# Patient Record
Sex: Male | Born: 1935 | Race: White | Hispanic: No | Marital: Married | Smoking: Never smoker
Health system: Southern US, Community
[De-identification: ages and names within clinical notes are randomized; demographics above are authoritative.]

## PROBLEM LIST (undated history)

## (undated) DIAGNOSIS — K439 Ventral hernia without obstruction or gangrene: Secondary | ICD-10-CM

## (undated) DIAGNOSIS — Z8709 Personal history of other diseases of the respiratory system: Secondary | ICD-10-CM

## (undated) DIAGNOSIS — I209 Angina pectoris, unspecified: Secondary | ICD-10-CM

## (undated) DIAGNOSIS — I2510 Atherosclerotic heart disease of native coronary artery without angina pectoris: Secondary | ICD-10-CM

## (undated) DIAGNOSIS — E785 Hyperlipidemia, unspecified: Secondary | ICD-10-CM

## (undated) DIAGNOSIS — Z860100 Personal history of colon polyps, unspecified: Secondary | ICD-10-CM

## (undated) DIAGNOSIS — R3915 Urgency of urination: Secondary | ICD-10-CM

## (undated) DIAGNOSIS — N400 Enlarged prostate without lower urinary tract symptoms: Secondary | ICD-10-CM

## (undated) DIAGNOSIS — M152 Bouchard's nodes (with arthropathy): Secondary | ICD-10-CM

## (undated) DIAGNOSIS — R001 Bradycardia, unspecified: Secondary | ICD-10-CM

## (undated) DIAGNOSIS — R3129 Other microscopic hematuria: Secondary | ICD-10-CM

## (undated) DIAGNOSIS — Z950 Presence of cardiac pacemaker: Secondary | ICD-10-CM

## (undated) DIAGNOSIS — I714 Abdominal aortic aneurysm, without rupture: Secondary | ICD-10-CM

## (undated) DIAGNOSIS — R269 Unspecified abnormalities of gait and mobility: Secondary | ICD-10-CM

## (undated) DIAGNOSIS — Z8619 Personal history of other infectious and parasitic diseases: Secondary | ICD-10-CM

## (undated) DIAGNOSIS — R55 Syncope and collapse: Secondary | ICD-10-CM

## (undated) DIAGNOSIS — I4891 Unspecified atrial fibrillation: Secondary | ICD-10-CM

## (undated) DIAGNOSIS — R29898 Other symptoms and signs involving the musculoskeletal system: Secondary | ICD-10-CM

## (undated) DIAGNOSIS — I10 Essential (primary) hypertension: Secondary | ICD-10-CM

## (undated) DIAGNOSIS — K5790 Diverticulosis of intestine, part unspecified, without perforation or abscess without bleeding: Secondary | ICD-10-CM

## (undated) DIAGNOSIS — G729 Myopathy, unspecified: Secondary | ICD-10-CM

## (undated) HISTORY — PX: LOW ANTERIOR BOWEL RESECTION: SUR1240

## (undated) HISTORY — DX: Unspecified atrial fibrillation: I48.91

## (undated) HISTORY — DX: Atherosclerotic heart disease of native coronary artery without angina pectoris: I25.10

## (undated) HISTORY — DX: Essential (primary) hypertension: I10

## (undated) HISTORY — PX: CORONARY ARTERY BYPASS GRAFT: SHX141

## (undated) HISTORY — DX: Syncope and collapse: R55

## (undated) HISTORY — DX: Abdominal aortic aneurysm, without rupture: I71.4

## (undated) HISTORY — DX: Personal history of colonic polyps: Z86.010

## (undated) HISTORY — DX: Bouchard's nodes (with arthropathy): M15.2

## (undated) HISTORY — PX: CATARACT EXTRACTION: SUR2

## (undated) HISTORY — DX: Unspecified abnormalities of gait and mobility: R26.9

## (undated) HISTORY — PX: COLON RESECTION: SHX5231

## (undated) HISTORY — DX: Personal history of colon polyps, unspecified: Z86.0100

## (undated) HISTORY — PX: OTHER SURGICAL HISTORY: SHX169

## (undated) HISTORY — PX: CARDIAC CATHETERIZATION: SHX172

## (undated) HISTORY — DX: Myopathy, unspecified: G72.9

## (undated) HISTORY — DX: Bradycardia, unspecified: R00.1

## (undated) HISTORY — DX: Other microscopic hematuria: R31.29

## (undated) HISTORY — DX: Hyperlipidemia, unspecified: E78.5

## (undated) HISTORY — DX: Benign prostatic hyperplasia without lower urinary tract symptoms: N40.0

## (undated) HISTORY — PX: COLON SURGERY: SHX602

## (undated) HISTORY — DX: Urgency of urination: R39.15

## (undated) HISTORY — DX: Other symptoms and signs involving the musculoskeletal system: R29.898

## (undated) HISTORY — DX: Ventral hernia without obstruction or gangrene: K43.9

## (undated) HISTORY — DX: Diverticulosis of intestine, part unspecified, without perforation or abscess without bleeding: K57.90

---
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
## 2000-02-26 ENCOUNTER — Ambulatory Visit (HOSPITAL_COMMUNITY): Admission: RE | Admit: 2000-02-26 | Discharge: 2000-02-26 | Payer: Self-pay | Admitting: Gastroenterology

## 2003-06-17 DIAGNOSIS — I209 Angina pectoris, unspecified: Secondary | ICD-10-CM

## 2003-06-17 HISTORY — PX: OTHER SURGICAL HISTORY: SHX169

## 2003-06-17 HISTORY — DX: Angina pectoris, unspecified: I20.9

## 2003-09-22 ENCOUNTER — Encounter: Admission: RE | Admit: 2003-09-22 | Discharge: 2003-09-22 | Payer: Self-pay | Admitting: Internal Medicine

## 2003-11-08 ENCOUNTER — Encounter: Admission: RE | Admit: 2003-11-08 | Discharge: 2003-11-08 | Payer: Self-pay | Admitting: Interventional Cardiology

## 2003-11-09 ENCOUNTER — Inpatient Hospital Stay (HOSPITAL_BASED_OUTPATIENT_CLINIC_OR_DEPARTMENT_OTHER): Admission: RE | Admit: 2003-11-09 | Discharge: 2003-11-09 | Payer: Self-pay | Admitting: Interventional Cardiology

## 2003-11-16 ENCOUNTER — Inpatient Hospital Stay (HOSPITAL_COMMUNITY): Admission: RE | Admit: 2003-11-16 | Discharge: 2003-11-25 | Payer: Self-pay | Admitting: Surgery

## 2006-08-13 ENCOUNTER — Ambulatory Visit (HOSPITAL_BASED_OUTPATIENT_CLINIC_OR_DEPARTMENT_OTHER): Admission: RE | Admit: 2006-08-13 | Discharge: 2006-08-13 | Payer: Self-pay | Admitting: Urology

## 2010-06-16 HISTORY — PX: EYE SURGERY: SHX253

## 2010-06-19 ENCOUNTER — Ambulatory Visit: Payer: Self-pay | Admitting: Ophthalmology

## 2010-07-02 ENCOUNTER — Ambulatory Visit: Payer: Self-pay | Admitting: Ophthalmology

## 2010-10-01 ENCOUNTER — Ambulatory Visit: Payer: Self-pay | Admitting: Ophthalmology

## 2011-06-25 DIAGNOSIS — J209 Acute bronchitis, unspecified: Secondary | ICD-10-CM | POA: Diagnosis not present

## 2011-07-09 DIAGNOSIS — R5381 Other malaise: Secondary | ICD-10-CM | POA: Diagnosis not present

## 2011-07-09 DIAGNOSIS — R55 Syncope and collapse: Secondary | ICD-10-CM | POA: Diagnosis not present

## 2011-07-09 DIAGNOSIS — E782 Mixed hyperlipidemia: Secondary | ICD-10-CM | POA: Diagnosis not present

## 2011-07-09 DIAGNOSIS — I10 Essential (primary) hypertension: Secondary | ICD-10-CM | POA: Diagnosis not present

## 2011-07-09 DIAGNOSIS — I2510 Atherosclerotic heart disease of native coronary artery without angina pectoris: Secondary | ICD-10-CM | POA: Diagnosis not present

## 2011-07-09 DIAGNOSIS — I4891 Unspecified atrial fibrillation: Secondary | ICD-10-CM | POA: Diagnosis not present

## 2011-08-21 ENCOUNTER — Other Ambulatory Visit: Payer: Self-pay | Admitting: Internal Medicine

## 2011-08-21 DIAGNOSIS — IMO0002 Reserved for concepts with insufficient information to code with codable children: Secondary | ICD-10-CM | POA: Diagnosis not present

## 2011-08-21 DIAGNOSIS — M5416 Radiculopathy, lumbar region: Secondary | ICD-10-CM

## 2011-08-21 DIAGNOSIS — R3915 Urgency of urination: Secondary | ICD-10-CM | POA: Diagnosis not present

## 2011-08-21 DIAGNOSIS — R5383 Other fatigue: Secondary | ICD-10-CM | POA: Diagnosis not present

## 2011-08-21 DIAGNOSIS — R5381 Other malaise: Secondary | ICD-10-CM | POA: Diagnosis not present

## 2011-08-21 DIAGNOSIS — I10 Essential (primary) hypertension: Secondary | ICD-10-CM | POA: Diagnosis not present

## 2011-08-21 DIAGNOSIS — I2510 Atherosclerotic heart disease of native coronary artery without angina pectoris: Secondary | ICD-10-CM | POA: Diagnosis not present

## 2011-08-21 DIAGNOSIS — Z1331 Encounter for screening for depression: Secondary | ICD-10-CM | POA: Diagnosis not present

## 2011-08-21 DIAGNOSIS — E782 Mixed hyperlipidemia: Secondary | ICD-10-CM | POA: Diagnosis not present

## 2011-08-21 DIAGNOSIS — Z0000 Encounter for general adult medical examination without abnormal findings: Secondary | ICD-10-CM | POA: Diagnosis not present

## 2011-08-21 DIAGNOSIS — Z79899 Other long term (current) drug therapy: Secondary | ICD-10-CM | POA: Diagnosis not present

## 2011-08-21 DIAGNOSIS — H6120 Impacted cerumen, unspecified ear: Secondary | ICD-10-CM | POA: Diagnosis not present

## 2011-08-22 ENCOUNTER — Ambulatory Visit
Admission: RE | Admit: 2011-08-22 | Discharge: 2011-08-22 | Disposition: A | Discharge status: Home / Self Care | Payer: Medicare Other | Source: Ambulatory Visit | Attending: Internal Medicine | Admitting: Internal Medicine

## 2011-08-22 DIAGNOSIS — M47817 Spondylosis without myelopathy or radiculopathy, lumbosacral region: Secondary | ICD-10-CM | POA: Diagnosis not present

## 2011-08-22 DIAGNOSIS — M5416 Radiculopathy, lumbar region: Secondary | ICD-10-CM

## 2011-08-22 DIAGNOSIS — M5126 Other intervertebral disc displacement, lumbar region: Secondary | ICD-10-CM | POA: Diagnosis not present

## 2011-08-22 DIAGNOSIS — M8448XA Pathological fracture, other site, initial encounter for fracture: Secondary | ICD-10-CM | POA: Diagnosis not present

## 2011-09-08 DIAGNOSIS — R3915 Urgency of urination: Secondary | ICD-10-CM | POA: Diagnosis not present

## 2011-09-08 DIAGNOSIS — R351 Nocturia: Secondary | ICD-10-CM | POA: Diagnosis not present

## 2011-09-08 DIAGNOSIS — N401 Enlarged prostate with lower urinary tract symptoms: Secondary | ICD-10-CM | POA: Diagnosis not present

## 2011-10-10 DIAGNOSIS — R351 Nocturia: Secondary | ICD-10-CM | POA: Diagnosis not present

## 2011-10-10 DIAGNOSIS — R3915 Urgency of urination: Secondary | ICD-10-CM | POA: Diagnosis not present

## 2011-10-21 DIAGNOSIS — R351 Nocturia: Secondary | ICD-10-CM | POA: Diagnosis not present

## 2011-10-21 DIAGNOSIS — N401 Enlarged prostate with lower urinary tract symptoms: Secondary | ICD-10-CM | POA: Diagnosis not present

## 2011-10-21 DIAGNOSIS — N281 Cyst of kidney, acquired: Secondary | ICD-10-CM | POA: Diagnosis not present

## 2011-10-21 DIAGNOSIS — R3915 Urgency of urination: Secondary | ICD-10-CM | POA: Diagnosis not present

## 2011-11-04 DIAGNOSIS — R55 Syncope and collapse: Secondary | ICD-10-CM | POA: Diagnosis not present

## 2011-11-04 DIAGNOSIS — Z951 Presence of aortocoronary bypass graft: Secondary | ICD-10-CM | POA: Diagnosis not present

## 2011-11-04 DIAGNOSIS — I2510 Atherosclerotic heart disease of native coronary artery without angina pectoris: Secondary | ICD-10-CM | POA: Diagnosis not present

## 2011-11-04 DIAGNOSIS — I10 Essential (primary) hypertension: Secondary | ICD-10-CM | POA: Diagnosis not present

## 2011-11-04 DIAGNOSIS — E785 Hyperlipidemia, unspecified: Secondary | ICD-10-CM | POA: Diagnosis not present

## 2011-11-04 DIAGNOSIS — R6889 Other general symptoms and signs: Secondary | ICD-10-CM | POA: Diagnosis not present

## 2011-11-14 DIAGNOSIS — I2510 Atherosclerotic heart disease of native coronary artery without angina pectoris: Secondary | ICD-10-CM | POA: Diagnosis not present

## 2011-11-14 DIAGNOSIS — I10 Essential (primary) hypertension: Secondary | ICD-10-CM | POA: Diagnosis not present

## 2011-11-14 DIAGNOSIS — IMO0002 Reserved for concepts with insufficient information to code with codable children: Secondary | ICD-10-CM | POA: Diagnosis not present

## 2011-11-14 DIAGNOSIS — R5381 Other malaise: Secondary | ICD-10-CM | POA: Diagnosis not present

## 2011-11-14 DIAGNOSIS — R5383 Other fatigue: Secondary | ICD-10-CM | POA: Diagnosis not present

## 2011-11-14 DIAGNOSIS — E782 Mixed hyperlipidemia: Secondary | ICD-10-CM | POA: Diagnosis not present

## 2011-11-14 DIAGNOSIS — R55 Syncope and collapse: Secondary | ICD-10-CM | POA: Diagnosis not present

## 2011-11-17 DIAGNOSIS — I10 Essential (primary) hypertension: Secondary | ICD-10-CM | POA: Diagnosis not present

## 2011-11-17 DIAGNOSIS — E782 Mixed hyperlipidemia: Secondary | ICD-10-CM | POA: Diagnosis not present

## 2011-11-17 DIAGNOSIS — I2510 Atherosclerotic heart disease of native coronary artery without angina pectoris: Secondary | ICD-10-CM | POA: Diagnosis not present

## 2011-11-17 DIAGNOSIS — R55 Syncope and collapse: Secondary | ICD-10-CM | POA: Diagnosis not present

## 2011-11-24 ENCOUNTER — Encounter: Payer: Self-pay | Admitting: Interventional Cardiology

## 2011-11-24 DIAGNOSIS — R55 Syncope and collapse: Secondary | ICD-10-CM | POA: Diagnosis not present

## 2011-11-24 DIAGNOSIS — I2510 Atherosclerotic heart disease of native coronary artery without angina pectoris: Secondary | ICD-10-CM | POA: Diagnosis not present

## 2011-12-08 DIAGNOSIS — I2510 Atherosclerotic heart disease of native coronary artery without angina pectoris: Secondary | ICD-10-CM | POA: Diagnosis not present

## 2011-12-08 DIAGNOSIS — I4510 Unspecified right bundle-branch block: Secondary | ICD-10-CM | POA: Diagnosis not present

## 2011-12-08 DIAGNOSIS — R55 Syncope and collapse: Secondary | ICD-10-CM | POA: Diagnosis not present

## 2011-12-08 DIAGNOSIS — I4891 Unspecified atrial fibrillation: Secondary | ICD-10-CM | POA: Diagnosis not present

## 2011-12-08 DIAGNOSIS — E782 Mixed hyperlipidemia: Secondary | ICD-10-CM | POA: Diagnosis not present

## 2011-12-11 ENCOUNTER — Encounter: Payer: Self-pay | Admitting: *Deleted

## 2011-12-12 ENCOUNTER — Encounter: Payer: Self-pay | Admitting: Internal Medicine

## 2011-12-12 ENCOUNTER — Ambulatory Visit (INDEPENDENT_AMBULATORY_CARE_PROVIDER_SITE_OTHER): Payer: Medicare Other | Admitting: Internal Medicine

## 2011-12-12 ENCOUNTER — Encounter: Payer: Self-pay | Admitting: *Deleted

## 2011-12-12 VITALS — BP 128/72 | HR 60 | Ht 66.0 in | Wt 162.0 lb

## 2011-12-12 DIAGNOSIS — I498 Other specified cardiac arrhythmias: Secondary | ICD-10-CM

## 2011-12-12 DIAGNOSIS — I4891 Unspecified atrial fibrillation: Secondary | ICD-10-CM | POA: Diagnosis not present

## 2011-12-12 DIAGNOSIS — I2510 Atherosclerotic heart disease of native coronary artery without angina pectoris: Secondary | ICD-10-CM | POA: Diagnosis not present

## 2011-12-12 DIAGNOSIS — R55 Syncope and collapse: Secondary | ICD-10-CM | POA: Diagnosis not present

## 2011-12-12 DIAGNOSIS — R001 Bradycardia, unspecified: Secondary | ICD-10-CM

## 2011-12-12 DIAGNOSIS — I495 Sick sinus syndrome: Secondary | ICD-10-CM

## 2011-12-12 LAB — BASIC METABOLIC PANEL
BUN: 15 mg/dL (ref 6–23)
CO2: 27 mEq/L (ref 19–32)
Calcium: 8.6 mg/dL (ref 8.4–10.5)
Chloride: 94 mEq/L — ABNORMAL LOW (ref 96–112)
Creatinine, Ser: 1.1 mg/dL (ref 0.4–1.5)
Glucose, Bld: 95 mg/dL (ref 70–99)
Potassium: 4.2 mEq/L (ref 3.5–5.1)

## 2011-12-12 LAB — CBC WITH DIFFERENTIAL/PLATELET
Basophils Relative: 0.3 % (ref 0.0–3.0)
Eosinophils Absolute: 0.2 10*3/uL (ref 0.0–0.7)
Eosinophils Relative: 4.9 % (ref 0.0–5.0)
Lymphs Abs: 1.2 10*3/uL (ref 0.7–4.0)
MCV: 99.4 fl (ref 78.0–100.0)
Neutrophils Relative %: 57.5 % (ref 43.0–77.0)
Platelets: 151 10*3/uL (ref 150.0–400.0)
RDW: 12.8 % (ref 11.5–14.6)
WBC: 4.9 10*3/uL (ref 4.5–10.5)

## 2011-12-12 NOTE — Patient Instructions (Signed)

## 2011-12-12 NOTE — Progress Notes (Signed)
ELECROPHYSIOLOGY CONSULT NOTE  Patient ID: Edward Cannon, MRN: [id number], DOB/AGE: 1925-07-17 78 y.o. Admit date: (Not on file) Date of Consult: 12/12/2011   Primary Physician: Henrine Screws, MD Primary Cardiologist: hs  Chief Complaint: SYNCOPE AND SINUS NODE DYSFUNCTION   HPI Edward Cannon is a 78 y.o. male :   Seen at the request of Dr. Tamala Julian because of syncope and sinus node dysfunction.  He is now 8 years status post CABG. Ejection fraction is presumably normal. He has a history of atrial fibrillation.  His syncope history dates back 30 years. For many of these episodes he has had a recognizable prodrome that lasts a minute or so. He is able to "stay off" spelled by lying down. More recently however, he was hammering in the backyard and his neighbor saw him collapse. He then had a series of awakenings and loss of consciousness  He was   taken by EMS to the hospital where his heart rate was noted to be low and his blood pressure apparently normal. He has a history of long-term sinus node dysfunction with heart rates in the high 40s low 50s and low 60s. He is quite symptomatic when his heart rate is in the 40s.    He was given an event recorder. There are series of events where he has pauses of more than 3 seconds. These have been asymptomatic  Past Medical History  Diagnosis Date  . Hypertension   . Hyperlipidemia   . Microscopic hematuria   . Coronary artery disease S/P CABG   . Atrial fibrillation   . Bouchard nodes (DJD hand)   . Hx of colonic polyps   . Ventral hernia     Asymptomatic  . Diverticulosis     Wtih Anatomotic friability on colonscopy  . Urinary urgency     With Incontinence  . BPH (benign prostatic hypertrophy)   . 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 Syncope     Recurrent, second to Bradycardia  . Sinus node dysfunction       Surgical History:  Past Surgical History  Procedure Date  .  Coronary artery bypass graft     x3  . [place]     Obtuse marginal branch of the left circumflex   . Rima     to the LAD  . Svg 2005    to posterior lateral branch of the RCA  . Low anterior bowel resection     for large villous     Home Meds: Prior to Admission medications   Medication Sig Start Date End Date Taking? Authorizing Provider  Aspirin 81 MG EC tablet Take 81 mg by mouth daily.   Yes Historical Provider, MD  Cholecalciferol (VITAMIN D PO) Take 1,000 Units by mouth every other day.   Yes Historical Provider, MD  cholestyramine Lucrezia Starch) 4 G packet Take 1 packet by mouth as directed.   Yes Historical Provider, MD  lisinopril-hydrochlorothiazide (PRINZIDE,ZESTORETIC) 10-12.5 MG per tablet Take 1 tablet by mouth daily.   Yes Historical Provider, MD  Multiple Vitamin (MULTIVITAMIN) tablet Take 1 tablet by mouth daily.   Yes Historical Provider, MD  nitroGLYCERIN (NITROSTAT) 0.4 MG SL tablet Place 0.4 mg under the tongue as needed.   Yes Historical Provider, MD  Omega-3 Fatty Acids (FISH OIL) 1000 MG CAPS Take 1,000 mg by mouth 2 (two) times daily.   Yes Historical Provider, MD  Tamsulosin HCl (FLOMAX) 0.4 MG CAPS  11/21/11  Yes Historical Provider, MD  vitamin E (VITAMIN E) 400 UNIT capsule Take 400 Units by mouth daily.   Yes Historical Provider, MD  vitamin k 100 MCG tablet Take 100 mcg by mouth daily.   Yes Historical Provider, MD    Allergies:  Allergies  Allergen Reactions  . Hygroton (Chlorthalidone) Other (See Comments)    Syncope  . Metamucil (Psyllium) Other (See Comments)    Syncope  . Lipitor (Atorvastatin) Other (See Comments)    Dizziness  . Mevacor (Lovastatin) Other (See Comments)    Muscle cramps  . Crestor (Rosuvastatin) Other (See Comments)    Myalgias  . Pravastatin Other (See Comments)    Myalgias  . Vytorin (Ezetimibe-Simvastatin) Other (See Comments)    Myalgias  . Zetia (Ezetimibe) Other (See Comments)    Myalgias    History   Social History   . Marital Status: Married    Spouse Name: N/A    Number of Children: N/A  . Years of Education: N/A   Occupational History  . Not on file.   Social History Main Topics  . Smoking status: Never Smoker   . Smokeless tobacco: Never Used  . Alcohol Use: Not on file  . Drug Use: Not on file  . Sexually Active: Not on file   Other Topics Concern  . Not on file   Social History Narrative  . No narrative on file     Family History  Problem Relation Age of Onset  . Diabetes Mother   . Heart disease Mother     Pacemaker  . COPD Father   . Prostate cancer Father   . Pancreatic cancer Brother   . Other Brother     CABG  . Heart failure Brother   . Coronary artery disease Brother      ROS:  Please see the history of present illness.   Negative except  tinnitus  All other systems reviewed and negative.    Physical Exam: Blood pressure 128/72, pulse 60, height 5\' 6"  (1.676 m), weight 162 lb (73.483 kg). General: Well developed, well nourished male in no acute distress. Head: Normocephalic, atraumatic, sclera non-icteric, no xanthomas, nares are without discharge. Chest with mild kyphiosis Lymph Nodes:  none Neck: Negative for carotid bruits. JVD not elevated. Lungs: Clear bilaterally to auscultation without wheezes, rales, or rhonchi. Breathing is unlabored. Heart: Slo RRR with S1 S2. 2/6 systolic  Murmur rubs, or gallops appreciated. Abdomen: Soft, non-tender, non-distended with normoactive bowel sounds. No hepatomegaly. No rebound/guarding. No obvious abdominal masses. Msk:  Strength and tone appear normal for age. Extremities: No clubbing or cyanosis.some arthritic changes in the hands  1+   edema.  Distal pedal pulses are 2+ and equal bilaterally. Skin: Warm and Dry Neuro: Alert and oriented X 3. CN III-XII intact Grossly normal sensory and motor function . Psych:  Responds to questions appropriately with a normal affect.      Labs:  EKG:  Sinus rhythm at 49 with  significant variation in RR intervals with dropping rates in the mid-30s Intervals 15/11/44 Axis -25 Incomplete right bundle branch block and T-wave flattening   Assessment and Plan:  Edward Cannon

## 2011-12-13 ENCOUNTER — Other Ambulatory Visit: Payer: Self-pay | Admitting: Internal Medicine

## 2011-12-13 DIAGNOSIS — I4949 Other premature depolarization: Secondary | ICD-10-CM

## 2011-12-13 DIAGNOSIS — I4891 Unspecified atrial fibrillation: Secondary | ICD-10-CM | POA: Insufficient documentation

## 2011-12-13 DIAGNOSIS — I25810 Atherosclerosis of coronary artery bypass graft(s) without angina pectoris: Secondary | ICD-10-CM | POA: Insufficient documentation

## 2011-12-13 DIAGNOSIS — R55 Syncope and collapse: Secondary | ICD-10-CM | POA: Insufficient documentation

## 2011-12-13 DIAGNOSIS — I495 Sick sinus syndrome: Secondary | ICD-10-CM

## 2011-12-13 NOTE — Assessment & Plan Note (Signed)
As above It is possible that the mechanisms are other, but LV function being near normal makes VT unlikely and will have to follow for the possibility of vasomotor mechanisms

## 2011-12-13 NOTE — Assessment & Plan Note (Signed)
Pt has sinus node dysfunction which is quite symptomatic with hr in the 40s  In addition he has sinus arrest and pauases >3.5 sec, which while not symptomatic may be responsible for the syncopal events that hte patient has suffered.  Given the symptoms assoc with chronic bradycardia and the potential to alleviate syncope 2/2 SND will proceed with pacing  The benefits and risks were reviewed including but not limited to death,  perforation, infection, lead dislodgement and device malfunction.  The patient understands agrees and is willing to proceed.

## 2011-12-13 NOTE — Assessment & Plan Note (Signed)
As above.

## 2011-12-13 NOTE — Assessment & Plan Note (Signed)
Not sure how big a burden this is but recording the atrial channel will allow [place] the opportunity to assess and change anticoagulatnts if necessary

## 2011-12-15 ENCOUNTER — Encounter (HOSPITAL_COMMUNITY): Payer: Self-pay | Admitting: Pharmacist

## 2011-12-15 ENCOUNTER — Encounter: Payer: Self-pay | Admitting: Interventional Cardiology

## 2011-12-16 ENCOUNTER — Encounter: Payer: Self-pay | Admitting: Internal Medicine

## 2011-12-16 ENCOUNTER — Telehealth: Payer: Self-pay | Admitting: Internal Medicine

## 2011-12-16 NOTE — Telephone Encounter (Signed)
I spoke with the patient and answered his questions. 

## 2011-12-16 NOTE — Telephone Encounter (Signed)
Please return call to patient at [phone] regarding upcoming pacer implant procedure on 7/8

## 2011-12-17 NOTE — Addendum Note (Signed)
Addended by: Kathreen Cornfield on: 12/17/2011 08:51 AM   Modules accepted: Orders

## 2011-12-21 DIAGNOSIS — E785 Hyperlipidemia, unspecified: Secondary | ICD-10-CM | POA: Diagnosis not present

## 2011-12-21 DIAGNOSIS — I10 Essential (primary) hypertension: Secondary | ICD-10-CM | POA: Diagnosis not present

## 2011-12-21 DIAGNOSIS — I4891 Unspecified atrial fibrillation: Secondary | ICD-10-CM | POA: Diagnosis not present

## 2011-12-21 DIAGNOSIS — R55 Syncope and collapse: Secondary | ICD-10-CM | POA: Diagnosis not present

## 2011-12-21 DIAGNOSIS — I495 Sick sinus syndrome: Secondary | ICD-10-CM | POA: Diagnosis not present

## 2011-12-21 DIAGNOSIS — N400 Enlarged prostate without lower urinary tract symptoms: Secondary | ICD-10-CM | POA: Diagnosis not present

## 2011-12-21 DIAGNOSIS — I2510 Atherosclerotic heart disease of native coronary artery without angina pectoris: Secondary | ICD-10-CM | POA: Diagnosis not present

## 2011-12-21 MED ORDER — SODIUM CHLORIDE 0.9 % IJ SOLN: 3.0000 mL | Freq: Two times a day (BID) | INTRAMUSCULAR | Status: DC

## 2011-12-21 MED ORDER — SODIUM CHLORIDE 0.9 % IJ SOLN: 3.0000 mL | INTRAMUSCULAR | Status: DC | PRN

## 2011-12-21 MED ORDER — SODIUM CHLORIDE 0.9 % IR SOLN
80.0000 mg | Status: DC
  Filled 2011-12-21: qty 2

## 2011-12-21 MED ORDER — SODIUM CHLORIDE 0.45 % IV SOLN
INTRAVENOUS | Status: DC
  Administered 2011-12-22: 10:00:00 via INTRAVENOUS

## 2011-12-21 MED ORDER — SODIUM CHLORIDE 0.9 % IV SOLN: 250.0000 mL | INTRAVENOUS | Status: AC

## 2011-12-21 MED ORDER — CEFAZOLIN SODIUM-DEXTROSE 2-3 GM-% IV SOLR
2.0000 g | INTRAVENOUS | Status: DC
  Filled 2011-12-21: qty 50

## 2011-12-22 ENCOUNTER — Encounter (HOSPITAL_COMMUNITY): Payer: Self-pay | Admitting: General Practice

## 2011-12-22 ENCOUNTER — Encounter (HOSPITAL_COMMUNITY)
Admission: RE | Disposition: A | Discharge status: Home / Self Care | Payer: Self-pay | Source: Ambulatory Visit | Attending: Internal Medicine

## 2011-12-22 ENCOUNTER — Ambulatory Visit (HOSPITAL_COMMUNITY): Payer: Medicare Other

## 2011-12-22 ENCOUNTER — Encounter: Payer: Self-pay | Admitting: *Deleted

## 2011-12-22 ENCOUNTER — Ambulatory Visit (HOSPITAL_COMMUNITY)
Admission: RE | Admit: 2011-12-22 | Discharge: 2011-12-23 | Disposition: A | Discharge status: Home / Self Care | Payer: Medicare Other | Source: Ambulatory Visit | Attending: Internal Medicine | Admitting: Internal Medicine

## 2011-12-22 DIAGNOSIS — Z950 Presence of cardiac pacemaker: Secondary | ICD-10-CM | POA: Insufficient documentation

## 2011-12-22 DIAGNOSIS — I4949 Other premature depolarization: Secondary | ICD-10-CM

## 2011-12-22 DIAGNOSIS — I4891 Unspecified atrial fibrillation: Secondary | ICD-10-CM | POA: Insufficient documentation

## 2011-12-22 DIAGNOSIS — I498 Other specified cardiac arrhythmias: Secondary | ICD-10-CM | POA: Diagnosis not present

## 2011-12-22 DIAGNOSIS — N400 Enlarged prostate without lower urinary tract symptoms: Secondary | ICD-10-CM | POA: Insufficient documentation

## 2011-12-22 DIAGNOSIS — R55 Syncope and collapse: Secondary | ICD-10-CM | POA: Insufficient documentation

## 2011-12-22 DIAGNOSIS — I10 Essential (primary) hypertension: Secondary | ICD-10-CM | POA: Insufficient documentation

## 2011-12-22 DIAGNOSIS — Z01811 Encounter for preprocedural respiratory examination: Secondary | ICD-10-CM | POA: Diagnosis not present

## 2011-12-22 DIAGNOSIS — I2510 Atherosclerotic heart disease of native coronary artery without angina pectoris: Secondary | ICD-10-CM | POA: Insufficient documentation

## 2011-12-22 DIAGNOSIS — E785 Hyperlipidemia, unspecified: Secondary | ICD-10-CM | POA: Insufficient documentation

## 2011-12-22 DIAGNOSIS — I495 Sick sinus syndrome: Secondary | ICD-10-CM

## 2011-12-22 HISTORY — DX: Angina pectoris, unspecified: I20.9

## 2011-12-22 HISTORY — DX: Personal history of other infectious and parasitic diseases: Z86.19

## 2011-12-22 HISTORY — PX: PERMANENT PACEMAKER INSERTION: SHX5480

## 2011-12-22 HISTORY — PX: INSERT / REPLACE / REMOVE PACEMAKER: SUR710

## 2011-12-22 HISTORY — DX: Personal history of other diseases of the respiratory system: Z87.09

## 2011-12-22 HISTORY — DX: Presence of cardiac pacemaker: Z95.0

## 2011-12-22 LAB — SURGICAL PCR SCREEN
MRSA, PCR: NEGATIVE
Staphylococcus aureus: NEGATIVE

## 2011-12-22 SURGERY — PERMANENT PACEMAKER INSERTION
Anesthesia: LOCAL

## 2011-12-22 MED ORDER — SODIUM CHLORIDE 0.9 % IV SOLN
INTRAVENOUS | Status: AC
  Administered 2011-12-22: 12:00:00 via INTRAVENOUS

## 2011-12-22 MED ORDER — MUPIROCIN 2 % EX OINT
TOPICAL_OINTMENT | CUTANEOUS | Status: AC
  Filled 2011-12-22: qty 22

## 2011-12-22 MED ORDER — SODIUM CHLORIDE 0.9 % IV SOLN
INTRAVENOUS | Status: DC
  Administered 2011-12-22: 10:00:00 via INTRAVENOUS

## 2011-12-22 MED ORDER — LISINOPRIL-HYDROCHLOROTHIAZIDE 10-12.5 MG PO TABS: 1.0000 | ORAL_TABLET | Freq: Every morning | ORAL | Status: DC

## 2011-12-22 MED ORDER — LISINOPRIL 10 MG PO TABS
10.0000 mg | ORAL_TABLET | Freq: Every day | ORAL | Status: DC
  Administered 2011-12-23: 10 mg via ORAL
  Filled 2011-12-22 (×2): qty 1

## 2011-12-22 MED ORDER — LIDOCAINE HCL (PF) 1 % IJ SOLN
INTRAMUSCULAR | Status: AC
  Filled 2011-12-22: qty 60

## 2011-12-22 MED ORDER — CEFAZOLIN SODIUM 1-5 GM-% IV SOLN
1.0000 g | Freq: Four times a day (QID) | INTRAVENOUS | Status: AC
  Administered 2011-12-22 – 2011-12-23 (×3): 1 g via INTRAVENOUS
  Filled 2011-12-22 (×3): qty 50

## 2011-12-22 MED ORDER — ACETAMINOPHEN 325 MG PO TABS
325.0000 mg | ORAL_TABLET | ORAL | Status: DC | PRN
Start: 2011-12-22 — End: 2011-12-23
  Administered 2011-12-22: 650 mg via ORAL
  Filled 2011-12-22 (×2): qty 2

## 2011-12-22 MED ORDER — VITAMIN E 180 MG (400 UNIT) PO CAPS
400.0000 [IU] | ORAL_CAPSULE | Freq: Every morning | ORAL | Status: DC
  Administered 2011-12-23: 400 [IU] via ORAL
  Filled 2011-12-22 (×2): qty 1

## 2011-12-22 MED ORDER — HEPARIN (PORCINE) IN NACL 2-0.9 UNIT/ML-% IJ SOLN
INTRAMUSCULAR | Status: AC
  Filled 2011-12-22: qty 1000

## 2011-12-22 MED ORDER — ASPIRIN EC 81 MG PO TBEC
81.0000 mg | DELAYED_RELEASE_TABLET | Freq: Every evening | ORAL | Status: DC
  Administered 2011-12-22: 81 mg via ORAL
  Filled 2011-12-22 (×2): qty 1

## 2011-12-22 MED ORDER — CEFAZOLIN SODIUM-DEXTROSE 2-3 GM-% IV SOLR
INTRAVENOUS | Status: AC
  Filled 2011-12-22: qty 50

## 2011-12-22 MED ORDER — MIDAZOLAM HCL 5 MG/5ML IJ SOLN
INTRAMUSCULAR | Status: AC
  Filled 2011-12-22: qty 5

## 2011-12-22 MED ORDER — TAMSULOSIN HCL 0.4 MG PO CAPS
0.4000 mg | ORAL_CAPSULE | Freq: Every morning | ORAL | Status: DC
  Administered 2011-12-23: 0.4 mg via ORAL
  Filled 2011-12-22 (×2): qty 1

## 2011-12-22 MED ORDER — CHOLESTYRAMINE 4 G PO PACK
1.0000 | PACK | Freq: Two times a day (BID) | ORAL | Status: DC
  Administered 2011-12-22 – 2011-12-23 (×2): 1 via ORAL
  Filled 2011-12-22 (×5): qty 1

## 2011-12-22 MED ORDER — CHLORHEXIDINE GLUCONATE 4 % EX LIQD
60.0000 mL | Freq: Once | CUTANEOUS | Status: DC
  Filled 2011-12-22: qty 60

## 2011-12-22 MED ORDER — HYDROCHLOROTHIAZIDE 12.5 MG PO CAPS
12.5000 mg | ORAL_CAPSULE | Freq: Every day | ORAL | Status: DC
  Administered 2011-12-22 – 2011-12-23 (×2): 12.5 mg via ORAL
  Filled 2011-12-22 (×2): qty 1

## 2011-12-22 MED ORDER — FENTANYL CITRATE 0.05 MG/ML IJ SOLN
INTRAMUSCULAR | Status: AC
  Filled 2011-12-22: qty 2

## 2011-12-22 MED ORDER — VITAMIN D3 25 MCG (1000 UNIT) PO TABS
1000.0000 [IU] | ORAL_TABLET | ORAL | Status: DC
  Filled 2011-12-22: qty 1

## 2011-12-22 MED ORDER — MUPIROCIN 2 % EX OINT
TOPICAL_OINTMENT | Freq: Two times a day (BID) | CUTANEOUS | Status: DC
  Administered 2011-12-22 (×2): via NASAL
  Filled 2011-12-22: qty 22

## 2011-12-22 MED ORDER — TEARS RENEWED OP SOLN
1.0000 [drp] | Freq: Three times a day (TID) | OPHTHALMIC | Status: DC | PRN
  Filled 2011-12-22: qty 15

## 2011-12-22 MED ORDER — ONDANSETRON HCL 4 MG/2ML IJ SOLN: 4.0000 mg | Freq: Four times a day (QID) | INTRAMUSCULAR | Status: DC | PRN

## 2011-12-22 MED ORDER — TAB-A-VITE/IRON PO TABS
1.0000 | ORAL_TABLET | ORAL | Status: DC
  Filled 2011-12-22: qty 1

## 2011-12-22 MED ORDER — POLYVINYL ALCOHOL 1.4 % OP SOLN
1.0000 [drp] | Freq: Three times a day (TID) | OPHTHALMIC | Status: DC | PRN
  Filled 2011-12-22: qty 15

## 2011-12-22 NOTE — CV Procedure (Signed)
Preop DX::sinus node dysfunction Post op DX:: same  Procedure dual pacemaker implantation  After routine prep and drape, lidocaine was infiltrated in the prepectoral subclavicular region on the left side an incision was made and carried down to later the prepectoral fascia using electrocautery and sharp dissection a pocket was formed similarly. Hemostasis was obtained.  After this, we turned our attention to gaining accessm to the extrathoracic,left subclavian vein. This was accomplished without difficulty and without the aspiration of air or puncture of the artery. 2 separate venipunctures were accomplished; guidewires were placed and retained and sequentially 7 French sheath through which were  passed an Engineer, site fixaation ventricular lead serial number [id number] and an St Jude Active fixation atrial lead serial number [id number] .  The ventricular lead was manipulated to the right ventricular apex with a bipolar R wave was 10.6, the pacing impedance was 744, the threshold was 0.4 @ 0.4 msec  Current at threshold was   0.7  Ma   The right atrial lead was manipulated to the right atrial appendage with a bipolar P-wave  3.0, the pacing impedance was 681, the threshold 1.8@ 0.4 msec   Current at threshold was 3  Ma and the current of injury was brisk.  The ventricular lead was marked with a tie prior to the insertion of the atrial lead. The leads were affixed to the prepectoral fascia and attached to a  St Jude  pulse generator serial number [id number]  [id number].  Hemostasis was obtained. The pocket was copiously irrigated with antibiotic containing saline solution. The leads and the pulse generator were placed in the pocket and affixed to the prepectoral fascia. The wound was then closed in 3 layers in the normal fashion. The wound was washed dried and a benzoin Steri-Strip dressing was applied.  Needle  Count, sponge counts and instrument counts were correct at the end of the procedure .   The  patient tolerated the procedure without apparent complication.  Maryagnes Amos.D.

## 2011-12-22 NOTE — Interval H&P Note (Signed)
History and Physical Interval Note:  12/22/2011 9:18 AM  Edward Cannon  has presented today for surgery, with the diagnosis of bradicardia  The various methods of treatment have been discussed with the patient and family. After consideration of risks, benefits and other options for treatment, the patient has consented to  Procedure(s) (LRB): PERMANENT PACEMAKER INSERTION (N/A) as a surgical intervention .  The patient's history has been reviewed, patient examined, no change in status, stable for surgery.  I have reviewed the patients' chart and labs.  Questions were answered to the patient's satisfaction.     Virl Axe

## 2011-12-22 NOTE — H&P (View-Only) (Signed)
ELECROPHYSIOLOGY CONSULT NOTE  Patient ID: Edward Cannon, MRN: [id number], DOB/AGE: [birth year] 79 y.o. Admit date: (Not on file) Date of Consult: 12/12/2011   Primary Physician: Henrine Screws, MD Primary Cardiologist: hs  Chief Complaint: SYNCOPE AND SINUS NODE DYSFUNCTION   HPI Edward Cannon is a 78 y.o. male :   Seen at the request of Dr. Tamala Julian because of syncope and sinus node dysfunction.  He is now 8 years status post CABG. Ejection fraction is presumably normal. He has a history of atrial fibrillation.  His syncope history dates back 30 years. For many of these episodes he has had a recognizable prodrome that lasts a minute or so. He is able to "stay off" spelled by lying down. More recently however, he was hammering in the backyard and his neighbor saw him collapse. He then had a series of awakenings and loss of consciousness  He was   taken by EMS to the hospital where his heart rate was noted to be low and his blood pressure apparently normal. He has a history of long-term sinus node dysfunction with heart rates in the high 40s low 50s and low 60s. He is quite symptomatic when his heart rate is in the 40s.    He was given an event recorder. There are series of events where he has pauses of more than 3 seconds. These have been asymptomatic  Past Medical History  Diagnosis Date  . Hypertension   . Hyperlipidemia   . Microscopic hematuria   . Coronary artery disease S/P CABG   . Atrial fibrillation   . Bouchard nodes (DJD hand)   . Hx of colonic polyps   . Ventral hernia     Asymptomatic  . Diverticulosis     Wtih Anatomotic friability on colonscopy  . Urinary urgency     With Incontinence  . BPH (benign prostatic hypertrophy)   . 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 Syncope     Recurrent, second to Bradycardia  . Sinus node dysfunction       Surgical History:  Past Surgical History  Procedure Date  .  Coronary artery bypass graft     x3  . [place]     Obtuse marginal branch of the left circumflex   . Rima     to the LAD  . Svg 2005    to posterior lateral branch of the RCA  . Low anterior bowel resection     for large villous     Home Meds: Prior to Admission medications   Medication Sig Start Date End Date Taking? Authorizing Provider  Aspirin 81 MG EC tablet Take 81 mg by mouth daily.   Yes Historical Provider, MD  Cholecalciferol (VITAMIN D PO) Take 1,000 Units by mouth every other day.   Yes Historical Provider, MD  cholestyramine Lucrezia Starch) 4 G packet Take 1 packet by mouth as directed.   Yes Historical Provider, MD  lisinopril-hydrochlorothiazide (PRINZIDE,ZESTORETIC) 10-12.5 MG per tablet Take 1 tablet by mouth daily.   Yes Historical Provider, MD  Multiple Vitamin (MULTIVITAMIN) tablet Take 1 tablet by mouth daily.   Yes Historical Provider, MD  nitroGLYCERIN (NITROSTAT) 0.4 MG SL tablet Place 0.4 mg under the tongue as needed.   Yes Historical Provider, MD  Omega-3 Fatty Acids (FISH OIL) 1000 MG CAPS Take 1,000 mg by mouth 2 (two) times daily.   Yes Historical Provider, MD  Tamsulosin HCl (FLOMAX) 0.4 MG CAPS  11/21/11  Yes Historical Provider, MD  vitamin E (VITAMIN E) 400 UNIT capsule Take 400 Units by mouth daily.   Yes Historical Provider, MD  vitamin k 100 MCG tablet Take 100 mcg by mouth daily.   Yes Historical Provider, MD    Allergies:  Allergies  Allergen Reactions  . Hygroton (Chlorthalidone) Other (See Comments)    Syncope  . Metamucil (Psyllium) Other (See Comments)    Syncope  . Lipitor (Atorvastatin) Other (See Comments)    Dizziness  . Mevacor (Lovastatin) Other (See Comments)    Muscle cramps  . Crestor (Rosuvastatin) Other (See Comments)    Myalgias  . Pravastatin Other (See Comments)    Myalgias  . Vytorin (Ezetimibe-Simvastatin) Other (See Comments)    Myalgias  . Zetia (Ezetimibe) Other (See Comments)    Myalgias    History   Social History   . Marital Status: Married    Spouse Name: N/A    Number of Children: N/A  . Years of Education: N/A   Occupational History  . Not on file.   Social History Main Topics  . Smoking status: Never Smoker   . Smokeless tobacco: Never Used  . Alcohol Use: Not on file  . Drug Use: Not on file  . Sexually Active: Not on file   Other Topics Concern  . Not on file   Social History Narrative  . No narrative on file     Family History  Problem Relation Age of Onset  . Diabetes Mother   . Heart disease Mother     Pacemaker  . COPD Father   . Prostate cancer Father   . Pancreatic cancer Brother   . Other Brother     CABG  . Heart failure Brother   . Coronary artery disease Brother      ROS:  Please see the history of present illness.   Negative except  tinnitus  All other systems reviewed and negative.    Physical Exam: Blood pressure 128/72, pulse 60, height 5\' 6"  (1.676 m), weight 162 lb (73.483 kg). General: Well developed, well nourished male in no acute distress. Head: Normocephalic, atraumatic, sclera non-icteric, no xanthomas, nares are without discharge. Chest with mild kyphiosis Lymph Nodes:  none Neck: Negative for carotid bruits. JVD not elevated. Lungs: Clear bilaterally to auscultation without wheezes, rales, or rhonchi. Breathing is unlabored. Heart: Slo RRR with S1 S2. 2/6 systolic  Murmur rubs, or gallops appreciated. Abdomen: Soft, non-tender, non-distended with normoactive bowel sounds. No hepatomegaly. No rebound/guarding. No obvious abdominal masses. Msk:  Strength and tone appear normal for age. Extremities: No clubbing or cyanosis.some arthritic changes in the hands  1+   edema.  Distal pedal pulses are 2+ and equal bilaterally. Skin: Warm and Dry Neuro: Alert and oriented X 3. CN III-XII intact Grossly normal sensory and motor function . Psych:  Responds to questions appropriately with a normal affect.      Labs:  EKG:  Sinus rhythm at 49 with  significant variation in RR intervals with dropping rates in the mid-30s Intervals 15/11/44 Axis -25 Incomplete right bundle branch block and T-wave flattening   Assessment and Plan:  Virl Axe

## 2011-12-23 ENCOUNTER — Ambulatory Visit (HOSPITAL_COMMUNITY): Payer: Medicare Other

## 2011-12-23 DIAGNOSIS — I4949 Other premature depolarization: Secondary | ICD-10-CM | POA: Diagnosis not present

## 2011-12-23 DIAGNOSIS — Z950 Presence of cardiac pacemaker: Secondary | ICD-10-CM | POA: Diagnosis not present

## 2011-12-23 NOTE — Progress Notes (Signed)
Orthopedic Tech Progress Note Patient Details:  Edward Cannon 22-Jul-1925 [id number]  Patient ID: [name], male   DOB: 23-Sep-1925, 78 y.o.   MRN: [id number]   Irish Elders 12/23/2011, 10:06 AM PT HAS ARM SLING ON

## 2011-12-23 NOTE — Progress Notes (Signed)
Pt. Discharged 12/23/2011  12:02 PM Discharge instructions reviewed with patient/family. Patient/family verbalized understanding. All Rx's given. Questions answered as needed. Pt. Discharged to home with family/self.  Josem Kaufmann

## 2011-12-23 NOTE — Progress Notes (Signed)
   ELECTROPHYSIOLOGY ROUNDING NOTE    Patient Name: Edward Cannon Date of Encounter:  12-23-2011    [place] feels well.  No chest pain or shortness of breath.  S/p dual chamber pacemaker implantation 12-22-2011 for syncope and bradycardia.   TELEMETRY: Reviewed telemetry pt in atrial pacing with intrinsic conduction. Filed Vitals:   12/22/11 1400 12/22/11 1500 12/22/11 2030 12/23/11 0300  BP: 172/91 150/75 164/87 123/71  Pulse:  60 62 59  Temp:   98.8 F (37.1 C) 98.2 F (36.8 C)  TempSrc:   Oral Oral  Resp:   16   Height:      Weight:      SpO2:   93% 95%   Well developed and nourished in no acute distress HENT normal Neck supple with JVP-flat Clear Pocket with small hematoma at inferolateral  Regular rate and rhythm, no murmurs or gallops Abd-soft with active BS No Clubbing cyanosis edema Skin-warm and dry A & Oriented  Grossly normal sensory and motor function     Intake/Output Summary (Last 24 hours) at 12/23/11 0730 Last data filed at 12/23/11 0300  Gross per 24 hour  Intake      0 ml  Output    950 ml  Net   -950 ml   Radiology/Studies:  Final result pending, leads in stable position.     DEVICE INTERROGATION: Device interrogated by industry.  Lead values including impedence, sensing, threshold within normal values.    Wound care, arm mobility, restrictions reviewed with patient.    Dr Tamala Julian requests that we do wound check appointment then he will follow pacemaker.  Resume driving following wound check  Will hold pressure on wound prior to discharge

## 2011-12-23 NOTE — Discharge Summary (Signed)
ELECTROPHYSIOLOGY PROCEDURE DISCHARGE SUMMARY    Patient ID: Edward Cannon,  MRN: [id number], DOB/AGE: 76-Nov-1927 76 y.o.  Admit date: 12/22/2011 Discharge date: 12/23/2011  Primary Care Physician: Marcellus Scott, MD Primary Cardiologist: Pernell Dupre, MD Electrophysiologist: Virl Axe, MD  Primary Discharge Diagnosis:  Sinus node dysfunction and syncope status post pacemaker implantation this admission  Secondary Discharge Diagnosis:  1. CAD s/p CABG 2005  2.  Hypertension 3.  Hyperlipidemia 4.  Atrial fibrillation 5.  BPH 6.  Bouchard nodes (DJD hand) 7.  Syncope  Procedures This Admission:  1.  Implantation of a dual chamber pacemaker on 12-22-2011 by Dr Caryl Comes.  The patient received a STJ 2210 pacemaker with model number 2088 right atrial and right ventricular leads.  There were no early complications. 2.  Chest x-ray on 12-23-2011 demonstrated no pneumothorax status post device implantation  Brief HPI: Edward Cannon is a 78 year old male with a history of syncope and sinus node dysfunction.  He has had documented heart rates in the 40's with up to 3 second pauses.  He was referred to Dr Caryl Comes in the outpatient setting for consideration of permanent pacemaker.  Risks, benefits, and alternatives were reviewed with the patient who wished to proceed.   Hospital Course:  The patient was admitted and underwent implantation of a dual chamber pacemaker with details as outlined above.   He was monitored on telemetry overnight which demonstrated atrial pacing with intrinsic ventricular conduction.  Left chest was without hematoma or ecchymosis.  The device was interrogated and found to be functioning normally.  CXR was obtained and demonstrated no pneumothorax status post device implantation.  Wound care, arm mobility, and restrictions were reviewed with the patient.  Dr Caryl Comes examined the patient and considered them stable for discharge to home.  Wound check will be done at [facility] with  further pacemaker follow up by Dr Tamala Julian.    Discharge Vitals: Blood pressure 123/71, pulse 59, temperature 98.2 F (36.8 C), temperature source Oral, resp. rate 16, height 5\' 6"  (1.676 m), weight 153 lb (69.4 kg), SpO2 95.00%.    Labs:   Lab Results  Component Value Date   WBC 4.9 12/12/2011   HGB 12.3* 12/12/2011   HCT 36.9* 12/12/2011   MCV 99.4 12/12/2011   PLT 151.0 12/12/2011    Discharge Medications:  Medication List  As of 12/23/2011 11:02 AM   TAKE these medications         Aspirin 81 MG EC tablet   Take 81 mg by mouth every evening.      cholecalciferol 1000 UNITS tablet   Commonly known as: VITAMIN D   Take 1,000 Units by mouth every other day.      cholestyramine 4 G packet   Commonly known as: QUESTRAN   Take 1 packet by mouth 2 (two) times daily. Take in 4 oz orange juice followed by 8 oz water      dextran 70-hypromellose ophthalmic solution   Place 1 drop into both eyes 3 (three) times daily as needed. For dry eyes      Fish Oil 1000 MG Caps   Take 2,000 mg by mouth 2 (two) times daily.      lisinopril-hydrochlorothiazide 10-12.5 MG per tablet   Commonly known as: PRINZIDE,ZESTORETIC   Take 1 tablet by mouth every morning.      multivitamin tablet   Take 1 tablet by mouth every other day. Without iron      multivitamins with iron Tabs  Take 1 tablet by mouth every other day.      nitroGLYCERIN 0.4 MG SL tablet   Commonly known as: NITROSTAT   Place 0.4 mg under the tongue every 5 (five) minutes x 3 doses as needed. For chest pain      Tamsulosin HCl 0.4 MG Caps   Commonly known as: FLOMAX   Take 0.4 mg by mouth every morning.      vitamin E 400 UNIT capsule   Generic drug: vitamin E   Take 400 Units by mouth every morning.      vitamin k 100 MCG tablet   Take 50 mcg by mouth once a week. Mondays            Disposition:  Discharge Orders    Future Appointments: Provider: Department: Dept Phone: Center:   01/01/2012 4:00 PM [facility] [place] [phone] [id number]     Follow-up Information    Follow up with [facility] on 01/01/2012. (4:00 PM)    Contact information:   [place]        Duration of Discharge Encounter: Greater than 30 minutes including physician time.  Signed, Chanetta Marshall, RN, BSN 12/23/2011, 11:02 AM

## 2011-12-26 DIAGNOSIS — R5383 Other fatigue: Secondary | ICD-10-CM | POA: Diagnosis not present

## 2011-12-26 DIAGNOSIS — E782 Mixed hyperlipidemia: Secondary | ICD-10-CM | POA: Diagnosis not present

## 2011-12-26 DIAGNOSIS — R5381 Other malaise: Secondary | ICD-10-CM | POA: Diagnosis not present

## 2011-12-26 DIAGNOSIS — I2510 Atherosclerotic heart disease of native coronary artery without angina pectoris: Secondary | ICD-10-CM | POA: Diagnosis not present

## 2011-12-26 DIAGNOSIS — I4510 Unspecified right bundle-branch block: Secondary | ICD-10-CM | POA: Diagnosis not present

## 2011-12-26 DIAGNOSIS — I4891 Unspecified atrial fibrillation: Secondary | ICD-10-CM | POA: Diagnosis not present

## 2011-12-26 DIAGNOSIS — IMO0002 Reserved for concepts with insufficient information to code with codable children: Secondary | ICD-10-CM | POA: Diagnosis not present

## 2011-12-26 DIAGNOSIS — R55 Syncope and collapse: Secondary | ICD-10-CM | POA: Diagnosis not present

## 2012-01-01 ENCOUNTER — Ambulatory Visit (INDEPENDENT_AMBULATORY_CARE_PROVIDER_SITE_OTHER): Payer: Medicare Other | Admitting: *Deleted

## 2012-01-01 ENCOUNTER — Encounter: Payer: Self-pay | Admitting: Internal Medicine

## 2012-01-01 DIAGNOSIS — I495 Sick sinus syndrome: Secondary | ICD-10-CM

## 2012-01-01 LAB — PACEMAKER DEVICE OBSERVATION
AL AMPLITUDE: 3.5 mv
AL IMPEDENCE PM: 525 Ohm
BAMS-0003: 70 {beats}/min
BATTERY VOLTAGE: 2.9779 V
RV LEAD AMPLITUDE: 12 mv
RV LEAD IMPEDENCE PM: 900 Ohm

## 2012-01-01 NOTE — Progress Notes (Signed)
Wound check-PPM 

## 2012-04-06 ENCOUNTER — Ambulatory Visit (INDEPENDENT_AMBULATORY_CARE_PROVIDER_SITE_OTHER): Payer: Medicare Other | Admitting: Internal Medicine

## 2012-04-06 ENCOUNTER — Encounter: Payer: Self-pay | Admitting: Internal Medicine

## 2012-04-06 VITALS — BP 171/84 | HR 55 | Ht 66.0 in | Wt 161.0 lb

## 2012-04-06 DIAGNOSIS — R55 Syncope and collapse: Secondary | ICD-10-CM

## 2012-04-06 DIAGNOSIS — I4891 Unspecified atrial fibrillation: Secondary | ICD-10-CM

## 2012-04-06 DIAGNOSIS — Z950 Presence of cardiac pacemaker: Secondary | ICD-10-CM

## 2012-04-06 DIAGNOSIS — I495 Sick sinus syndrome: Secondary | ICD-10-CM

## 2012-04-06 LAB — PACEMAKER DEVICE OBSERVATION
ATRIAL PACING PM: 48
BAMS-0001: 150 {beats}/min
BAMS-0003: 70 {beats}/min
BATTERY VOLTAGE: 2.993 V
RV LEAD AMPLITUDE: 12 mv

## 2012-04-06 NOTE — Assessment & Plan Note (Signed)
No intercurrent atrial fibrillation 

## 2012-04-06 NOTE — Assessment & Plan Note (Signed)
Patient continues to have syncope. It is clearly not related to vasomotor instability. We have reviewed options of isometric contraction while standing, external support stockings. With his significant systolic hypertension, I would not be in favor of the use of ProAmatine. He would like to try the former for right now.

## 2012-04-06 NOTE — Progress Notes (Signed)
Patient Care Team: Josetta Huddle, MD as PCP - General (Internal Medicine)   HPI  Edward Cannon is a 78 y.o. male Seen in followup for a pacemaker implanted 7/13 for sinus node dysfunction in the setting of ischemic heart disease with prior bypass surgery and normal left ventricular function. He also has a history of atrial fibrillation.  He was having syncope. Event recorder demonstrated a symptomatic pauses of greater than 3 seconds. He also has symptomatic bradycardia which prompted the implantation.  He has continued to have syncopal episodes. The symptoms occurred with prolonged standing notably in the kitchen. He is able to get to the chair where he has passed out. He has not fallen.  Past Medical History  Diagnosis Date  . Hypertension   . Hyperlipidemia   . Microscopic hematuria   . Coronary artery disease S/P CABG   . Atrial fibrillation   . Bouchard nodes (DJD hand)   . Hx of colonic polyps   . Ventral hernia     Asymptomatic  . Diverticulosis     Wtih Anatomotic friability on colonscopy  . Urinary urgency     With Incontinence  . BPH (benign prostatic hypertrophy)   . 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 Syncope     Recurrent, second to Bradycardia  . Sinus node dysfunction   . Anginal pain 2005  . Pacemaker   . History of bronchitis winter 2012  . H/O scarlet fever 1936    Past Surgical History  Procedure Date  . [place]     Obtuse marginal branch of the left circumflex   . Rima     to the LAD  . Svg 2005    to posterior lateral branch of the RCA  . Low anterior bowel resection     for large villous  . Colon resection ~ 2005  . Coronary artery bypass graft ~ 2009    CABG X3  . Mastoidectomy 1930  . Cardiac catheterization   . Colon surgery   . Insert / replace / remove pacemaker 12/22/11    Current Outpatient Prescriptions  Medication Sig Dispense Refill  . Aspirin 81 MG EC tablet Take 81 mg by mouth every  evening.       . cholecalciferol (VITAMIN D) 1000 UNITS tablet Take 1,000 Units by mouth every other day.      . cholestyramine (QUESTRAN) 4 G packet Take 1 packet by mouth 2 (two) times daily. Take in 4 oz orange juice followed by 8 oz water      . dextran 70-hypromellose (TEARS RENEWED) ophthalmic solution Place 1 drop into both eyes 3 (three) times daily as needed. For dry eyes      . lisinopril-hydrochlorothiazide (PRINZIDE,ZESTORETIC) 10-12.5 MG per tablet Take 1 tablet by mouth every morning.       . Multiple Vitamin (MULTIVITAMIN) tablet Take 1 tablet by mouth every other day. Without iron      . nitroGLYCERIN (NITROSTAT) 0.4 MG SL tablet Place 0.4 mg under the tongue every 5 (five) minutes x 3 doses as needed. For chest pain      . Omega-3 Fatty Acids (FISH OIL) 1000 MG CAPS Take 2,000 mg by mouth 2 (two) times daily.       . Tamsulosin HCl (FLOMAX) 0.4 MG CAPS Take 0.4 mg by mouth every morning.       . vitamin E (VITAMIN E) 400 UNIT capsule Take 400 Units by mouth every morning.       Marland Kitchen  vitamin k 100 MCG tablet Take 50 mcg by mouth once a week. Mondays        Allergies  Allergen Reactions  . Crestor (Rosuvastatin) Other (See Comments)    Myalgias  . Hygroton (Chlorthalidone) Other (See Comments)    Syncope  . Lipitor (Atorvastatin) Other (See Comments)    Dizziness  . Metamucil (Psyllium) Other (See Comments)    Syncope  . Mevacor (Lovastatin) Other (See Comments)    Muscle cramps  . Pravastatin Other (See Comments)    Myalgias  . Vytorin (Ezetimibe-Simvastatin) Other (See Comments)    Myalgias  . Zetia (Ezetimibe) Other (See Comments)    Myalgias    Review of Systems negative except from HPI and PMH  Physical Exam Ht 5\' 6"  (1.676 m)  Wt 161 lb (73.029 kg)  BMI 25.99 kg/m2 BP 171/84  Pulse 55  Ht 5\' 6"  (1.676 m)  Wt 161 lb (73.029 kg)  BMI 25.99 kg/m2  Well developed and well nourished in no acute distress HENT normal E scleral and icterus clear Neck  Supple JVP flat; carotids brisk and full Clear to ausculation .dp Device pocket well healed; without hematoma or erythema  Regular rate and rhythm, no murmurs gallops or rub Soft with active bowel sounds No clubbing cyanosis none Edema Alert and oriented, grossly normal motor and sensory function Skin Warm and Dry  Electrocardiogram demonstrates atrial pacing with intrinsic conduction intervals 16/11/43 Axis is leftward at -31  Assessment and  Plan

## 2012-04-06 NOTE — Assessment & Plan Note (Signed)
The patient's device was interrogated and the information was fully reviewed.  The device was reprogrammed to maximize longevity as well as to increase in his lower rate from 55-70

## 2012-04-28 DIAGNOSIS — N401 Enlarged prostate with lower urinary tract symptoms: Secondary | ICD-10-CM | POA: Diagnosis not present

## 2012-04-28 DIAGNOSIS — R351 Nocturia: Secondary | ICD-10-CM | POA: Diagnosis not present

## 2012-07-27 DIAGNOSIS — I10 Essential (primary) hypertension: Secondary | ICD-10-CM | POA: Diagnosis not present

## 2012-07-27 DIAGNOSIS — R55 Syncope and collapse: Secondary | ICD-10-CM | POA: Diagnosis not present

## 2012-07-27 DIAGNOSIS — I2510 Atherosclerotic heart disease of native coronary artery without angina pectoris: Secondary | ICD-10-CM | POA: Diagnosis not present

## 2012-07-27 DIAGNOSIS — I495 Sick sinus syndrome: Secondary | ICD-10-CM | POA: Diagnosis not present

## 2012-07-27 DIAGNOSIS — I4891 Unspecified atrial fibrillation: Secondary | ICD-10-CM | POA: Diagnosis not present

## 2012-07-27 DIAGNOSIS — Z950 Presence of cardiac pacemaker: Secondary | ICD-10-CM | POA: Diagnosis not present

## 2012-08-02 DIAGNOSIS — Z950 Presence of cardiac pacemaker: Secondary | ICD-10-CM | POA: Diagnosis not present

## 2012-08-02 DIAGNOSIS — I4891 Unspecified atrial fibrillation: Secondary | ICD-10-CM | POA: Diagnosis not present

## 2012-09-01 DIAGNOSIS — Z0000 Encounter for general adult medical examination without abnormal findings: Secondary | ICD-10-CM | POA: Diagnosis not present

## 2012-09-01 DIAGNOSIS — E782 Mixed hyperlipidemia: Secondary | ICD-10-CM | POA: Diagnosis not present

## 2012-09-01 DIAGNOSIS — H6120 Impacted cerumen, unspecified ear: Secondary | ICD-10-CM | POA: Diagnosis not present

## 2012-09-01 DIAGNOSIS — Z79899 Other long term (current) drug therapy: Secondary | ICD-10-CM | POA: Diagnosis not present

## 2012-09-01 DIAGNOSIS — I2510 Atherosclerotic heart disease of native coronary artery without angina pectoris: Secondary | ICD-10-CM | POA: Diagnosis not present

## 2012-09-01 DIAGNOSIS — I10 Essential (primary) hypertension: Secondary | ICD-10-CM | POA: Diagnosis not present

## 2012-09-01 DIAGNOSIS — E559 Vitamin D deficiency, unspecified: Secondary | ICD-10-CM | POA: Diagnosis not present

## 2012-09-01 DIAGNOSIS — Z950 Presence of cardiac pacemaker: Secondary | ICD-10-CM | POA: Diagnosis not present

## 2012-09-01 DIAGNOSIS — Z1331 Encounter for screening for depression: Secondary | ICD-10-CM | POA: Diagnosis not present

## 2012-09-01 DIAGNOSIS — R55 Syncope and collapse: Secondary | ICD-10-CM | POA: Diagnosis not present

## 2012-09-03 DIAGNOSIS — I10 Essential (primary) hypertension: Secondary | ICD-10-CM | POA: Diagnosis not present

## 2012-09-03 DIAGNOSIS — R404 Transient alteration of awareness: Secondary | ICD-10-CM | POA: Diagnosis not present

## 2012-09-03 DIAGNOSIS — E785 Hyperlipidemia, unspecified: Secondary | ICD-10-CM | POA: Diagnosis not present

## 2012-09-03 DIAGNOSIS — R5381 Other malaise: Secondary | ICD-10-CM | POA: Diagnosis not present

## 2012-09-03 DIAGNOSIS — R5383 Other fatigue: Secondary | ICD-10-CM | POA: Diagnosis not present

## 2012-09-03 DIAGNOSIS — Z950 Presence of cardiac pacemaker: Secondary | ICD-10-CM | POA: Diagnosis not present

## 2012-09-03 DIAGNOSIS — R55 Syncope and collapse: Secondary | ICD-10-CM | POA: Diagnosis not present

## 2012-09-07 DIAGNOSIS — I2510 Atherosclerotic heart disease of native coronary artery without angina pectoris: Secondary | ICD-10-CM | POA: Diagnosis not present

## 2012-09-07 DIAGNOSIS — E782 Mixed hyperlipidemia: Secondary | ICD-10-CM | POA: Diagnosis not present

## 2012-09-07 DIAGNOSIS — R55 Syncope and collapse: Secondary | ICD-10-CM | POA: Diagnosis not present

## 2012-09-07 DIAGNOSIS — I10 Essential (primary) hypertension: Secondary | ICD-10-CM | POA: Diagnosis not present

## 2012-10-21 DIAGNOSIS — H5210 Myopia, unspecified eye: Secondary | ICD-10-CM | POA: Diagnosis not present

## 2012-10-21 DIAGNOSIS — H02429 Myogenic ptosis of unspecified eyelid: Secondary | ICD-10-CM | POA: Diagnosis not present

## 2012-10-21 DIAGNOSIS — H04129 Dry eye syndrome of unspecified lacrimal gland: Secondary | ICD-10-CM | POA: Diagnosis not present

## 2012-10-27 DIAGNOSIS — R3915 Urgency of urination: Secondary | ICD-10-CM | POA: Diagnosis not present

## 2012-10-27 DIAGNOSIS — N401 Enlarged prostate with lower urinary tract symptoms: Secondary | ICD-10-CM | POA: Diagnosis not present

## 2012-11-04 DIAGNOSIS — I4891 Unspecified atrial fibrillation: Secondary | ICD-10-CM | POA: Diagnosis not present

## 2012-11-04 DIAGNOSIS — Z950 Presence of cardiac pacemaker: Secondary | ICD-10-CM | POA: Diagnosis not present

## 2013-02-15 DIAGNOSIS — I4891 Unspecified atrial fibrillation: Secondary | ICD-10-CM | POA: Diagnosis not present

## 2013-02-15 DIAGNOSIS — Z950 Presence of cardiac pacemaker: Secondary | ICD-10-CM | POA: Diagnosis not present

## 2013-03-29 DIAGNOSIS — R55 Syncope and collapse: Secondary | ICD-10-CM | POA: Diagnosis not present

## 2013-03-29 DIAGNOSIS — I2510 Atherosclerotic heart disease of native coronary artery without angina pectoris: Secondary | ICD-10-CM | POA: Diagnosis not present

## 2013-03-29 DIAGNOSIS — I959 Hypotension, unspecified: Secondary | ICD-10-CM | POA: Diagnosis not present

## 2013-03-29 DIAGNOSIS — Z950 Presence of cardiac pacemaker: Secondary | ICD-10-CM | POA: Diagnosis not present

## 2013-03-29 DIAGNOSIS — R42 Dizziness and giddiness: Secondary | ICD-10-CM | POA: Diagnosis not present

## 2013-03-29 DIAGNOSIS — R404 Transient alteration of awareness: Secondary | ICD-10-CM | POA: Diagnosis not present

## 2013-03-29 DIAGNOSIS — Z951 Presence of aortocoronary bypass graft: Secondary | ICD-10-CM | POA: Diagnosis not present

## 2013-05-23 ENCOUNTER — Encounter: Payer: Self-pay | Admitting: Internal Medicine

## 2013-05-23 ENCOUNTER — Ambulatory Visit (INDEPENDENT_AMBULATORY_CARE_PROVIDER_SITE_OTHER): Payer: Medicare Other | Admitting: *Deleted

## 2013-05-23 DIAGNOSIS — I4891 Unspecified atrial fibrillation: Secondary | ICD-10-CM | POA: Diagnosis not present

## 2013-05-23 DIAGNOSIS — I495 Sick sinus syndrome: Secondary | ICD-10-CM

## 2013-05-23 LAB — MDC_IDC_ENUM_SESS_TYPE_REMOTE
Battery Voltage: 2.96 V
Brady Statistic RA Percent Paced: 91 %
Implantable Pulse Generator Model: 2110
Lead Channel Pacing Threshold Amplitude: 0.75 V
Lead Channel Sensing Intrinsic Amplitude: 4.7 mV
Lead Channel Setting Pacing Amplitude: 0.875
Lead Channel Setting Pacing Pulse Width: 0.4 ms

## 2013-05-30 ENCOUNTER — Other Ambulatory Visit: Payer: Self-pay | Admitting: Internal Medicine

## 2013-06-17 ENCOUNTER — Encounter: Payer: Self-pay | Admitting: *Deleted

## 2013-06-17 DIAGNOSIS — H02839 Dermatochalasis of unspecified eye, unspecified eyelid: Secondary | ICD-10-CM | POA: Diagnosis not present

## 2013-06-23 DIAGNOSIS — H02839 Dermatochalasis of unspecified eye, unspecified eyelid: Secondary | ICD-10-CM | POA: Diagnosis not present

## 2013-07-26 ENCOUNTER — Ambulatory Visit (INDEPENDENT_AMBULATORY_CARE_PROVIDER_SITE_OTHER): Payer: Medicare Other | Admitting: Interventional Cardiology

## 2013-07-26 ENCOUNTER — Encounter: Payer: Self-pay | Admitting: Interventional Cardiology

## 2013-07-26 VITALS — BP 169/90 | HR 65 | Ht 66.0 in | Wt 160.0 lb

## 2013-07-26 DIAGNOSIS — I4891 Unspecified atrial fibrillation: Secondary | ICD-10-CM

## 2013-07-26 DIAGNOSIS — R55 Syncope and collapse: Secondary | ICD-10-CM

## 2013-07-26 DIAGNOSIS — Z950 Presence of cardiac pacemaker: Secondary | ICD-10-CM | POA: Diagnosis not present

## 2013-07-26 DIAGNOSIS — I495 Sick sinus syndrome: Secondary | ICD-10-CM

## 2013-07-26 DIAGNOSIS — I2510 Atherosclerotic heart disease of native coronary artery without angina pectoris: Secondary | ICD-10-CM | POA: Diagnosis not present

## 2013-07-26 NOTE — Patient Instructions (Signed)
Your physician recommends that you continue on your current medications as directed. Please refer to the Current Medication list given to you today.  Your physician wants you to follow-up in: 1 year. You will receive a reminder letter in the mail two months in advance. If you don't receive a letter, please call our office to schedule the follow-up appointment.  

## 2013-07-26 NOTE — Progress Notes (Signed)
Patient ID: Edward Cannon, male   DOB: 10/02/1925, 80 y.o.   MRN: [id number]    [address], [address] [place], [place]  [address] Phone: [phone] Fax:  [phone]  Date:  07/26/2013   ID:  Edward Cannon, DOB Nov 21, 1925, MRN [id number]  PCP:  Henrine Screws, MD   ASSESSMENT:  1. Paroxysmal atrial fibrillation 2. Hypertension 3. DDD pacemaker for sinus node dysfunction 4. Vasovagal syncope  PLAN:  1. We discussed vasovagal syncope and the response to his prodrome 2. No change in therapy 3. Clinical followup in one year   SUBJECTIVE: Edward Cannon is a 80 y.o. male who has a multiple year history of intermittent episodes of fainting that are preceded by anxiety/warning that he is going to faint. No chest pain or palpitations are noted. He wonders if they could be related to low blood sugar. He is bald a glucometer. He denies chest pain, dyspnea, orthopnea, PND, lower extremity swelling.   Wt Readings from Last 3 Encounters:  07/26/13 160 lb (72.576 kg)  04/06/12 161 lb (73.029 kg)  12/22/11 153 lb (69.4 kg)     Past Medical History  Diagnosis Date  . Hypertension   . Hyperlipidemia   . Microscopic hematuria   . Coronary artery disease S/P CABG   . Atrial fibrillation   . Bouchard nodes (DJD hand)   . Hx of colonic polyps   . Ventral hernia     Asymptomatic  . Diverticulosis     Wtih Anatomotic friability on colonscopy  . Urinary urgency     With Incontinence  . BPH (benign prostatic hypertrophy)   . 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 Syncope     Recurrent, second to Bradycardia  . Sinus node dysfunction   . Anginal pain 2005  . Pacemaker   . History of bronchitis winter 2012  . H/O scarlet fever 1936    Current Outpatient Prescriptions  Medication Sig Dispense Refill  . Aspirin 81 MG EC tablet Take 81 mg by mouth every evening.       . cholecalciferol (VITAMIN D) 1000 UNITS tablet Take  1,000 Units by mouth every other day.      . cholestyramine (QUESTRAN) 4 G packet Take 1 packet by mouth 2 (two) times daily. Take in 4 oz orange juice followed by 8 oz water      . dextran 70-hypromellose (TEARS RENEWED) ophthalmic solution Place 1 drop into both eyes 3 (three) times daily as needed. For dry eyes      . Multiple Vitamin (MULTIVITAMIN) tablet Take 1 tablet by mouth every other day. Without iron      . nitroGLYCERIN (NITROSTAT) 0.4 MG SL tablet Place 0.4 mg under the tongue every 5 (five) minutes x 3 doses as needed. For chest pain      . valsartan-hydrochlorothiazide (DIOVAN-HCT) 160-25 MG per tablet 1 tablet daily.       . vitamin E (VITAMIN E) 400 UNIT capsule Take 400 Units by mouth every morning.       . vitamin k 100 MCG tablet Take 50 mcg by mouth once a week. Mondays      . Tamsulosin HCl (FLOMAX) 0.4 MG CAPS Take 0.4 mg by mouth every morning.        No current facility-administered medications for this visit.    Allergies:    Allergies  Allergen Reactions  . Crestor [Rosuvastatin] Other (See Comments)  Myalgias  . Hygroton [Chlorthalidone] Other (See Comments)    Syncope  . Lipitor [Atorvastatin] Other (See Comments)    Dizziness  . Metamucil [Psyllium] Other (See Comments)    Syncope  . Mevacor [Lovastatin] Other (See Comments)    Muscle cramps  . Pravastatin Other (See Comments)    Myalgias  . Vytorin [Ezetimibe-Simvastatin] Other (See Comments)    Myalgias  . Zetia [Ezetimibe] Other (See Comments)    Myalgias    Social History:  The patient  reports that he has never smoked. He has never used smokeless tobacco. He reports that he drinks about 2.4 ounces of alcohol per week. He reports that he does not use illicit drugs.   ROS:  Please see the history of present illness.   Stable without tachycardia, syncope, dyspnea, or chest pain   All other systems reviewed and negative.   OBJECTIVE: VS:  BP 169/90  Pulse 65  Ht 5\' 6"  (1.676 m)  Wt 160 lb  (72.576 kg)  BMI 25.84 kg/m2 Well nourished, well developed, in no acute distress, appears than stated age HEENT: normal Neck: JVD flat. Carotid bruit absent  Cardiac:  normal S1, S2; RRR; no murmur Lungs:  clear to auscultation bilaterally, no wheezing, rhonchi or rales Abd: soft, nontender, no hepatomegaly Ext: Edema absent. Pulses 2+ Skin: warm and dry Neuro:  CNs 2-12 intact, no focal abnormalities noted  EKG:  AV sequential pacing       Signed, Illene Labrador III, MD 07/26/2013 12:01 PM

## 2013-08-05 DIAGNOSIS — G729 Myopathy, unspecified: Secondary | ICD-10-CM | POA: Diagnosis not present

## 2013-08-05 DIAGNOSIS — R269 Unspecified abnormalities of gait and mobility: Secondary | ICD-10-CM | POA: Diagnosis not present

## 2013-08-05 DIAGNOSIS — R29898 Other symptoms and signs involving the musculoskeletal system: Secondary | ICD-10-CM | POA: Diagnosis not present

## 2013-08-24 ENCOUNTER — Ambulatory Visit (INDEPENDENT_AMBULATORY_CARE_PROVIDER_SITE_OTHER): Payer: Medicare Other | Admitting: *Deleted

## 2013-08-24 ENCOUNTER — Encounter: Payer: Self-pay | Admitting: Internal Medicine

## 2013-08-24 DIAGNOSIS — I495 Sick sinus syndrome: Secondary | ICD-10-CM

## 2013-08-24 DIAGNOSIS — Z950 Presence of cardiac pacemaker: Secondary | ICD-10-CM

## 2013-08-24 LAB — MDC_IDC_ENUM_SESS_TYPE_REMOTE
Battery Remaining Longevity: 93 mo
Battery Voltage: 2.95 V
Brady Statistic AP VP Percent: 1 %
Brady Statistic AP VS Percent: 97 %
Brady Statistic RA Percent Paced: 97 %
Brady Statistic RV Percent Paced: 1 %
Date Time Interrogation Session: 20150312130214
Lead Channel Impedance Value: 430 Ohm
Lead Channel Impedance Value: 550 Ohm
Lead Channel Pacing Threshold Amplitude: 0.75 V
Lead Channel Sensing Intrinsic Amplitude: 4.7 mV
Lead Channel Setting Pacing Amplitude: 2 V
Lead Channel Setting Pacing Amplitude: 2 V
Lead Channel Setting Sensing Sensitivity: 2 mV
MDC IDC MSMT LEADCHNL RA PACING THRESHOLD PULSEWIDTH: 0.4 ms
MDC IDC MSMT LEADCHNL RV PACING THRESHOLD AMPLITUDE: 1 V
MDC IDC MSMT LEADCHNL RV PACING THRESHOLD PULSEWIDTH: 0.4 ms
MDC IDC MSMT LEADCHNL RV SENSING INTR AMPL: 9.5 mV
MDC IDC PG SERIAL: 7350984
MDC IDC SET LEADCHNL RV PACING PULSEWIDTH: 0.4 ms
MDC IDC STAT BRADY AS VP PERCENT: 1 %
MDC IDC STAT BRADY AS VS PERCENT: 2.7 %

## 2013-09-07 DIAGNOSIS — Z0000 Encounter for general adult medical examination without abnormal findings: Secondary | ICD-10-CM | POA: Diagnosis not present

## 2013-09-07 DIAGNOSIS — Z1331 Encounter for screening for depression: Secondary | ICD-10-CM | POA: Diagnosis not present

## 2013-09-07 DIAGNOSIS — R29898 Other symptoms and signs involving the musculoskeletal system: Secondary | ICD-10-CM | POA: Diagnosis not present

## 2013-09-07 DIAGNOSIS — E559 Vitamin D deficiency, unspecified: Secondary | ICD-10-CM | POA: Diagnosis not present

## 2013-09-07 DIAGNOSIS — R3915 Urgency of urination: Secondary | ICD-10-CM | POA: Diagnosis not present

## 2013-09-07 DIAGNOSIS — I2510 Atherosclerotic heart disease of native coronary artery without angina pectoris: Secondary | ICD-10-CM | POA: Diagnosis not present

## 2013-09-07 DIAGNOSIS — E782 Mixed hyperlipidemia: Secondary | ICD-10-CM | POA: Diagnosis not present

## 2013-09-07 DIAGNOSIS — I10 Essential (primary) hypertension: Secondary | ICD-10-CM | POA: Diagnosis not present

## 2013-09-07 DIAGNOSIS — R5381 Other malaise: Secondary | ICD-10-CM | POA: Diagnosis not present

## 2013-09-07 DIAGNOSIS — R55 Syncope and collapse: Secondary | ICD-10-CM | POA: Diagnosis not present

## 2013-09-07 DIAGNOSIS — R5383 Other fatigue: Secondary | ICD-10-CM | POA: Diagnosis not present

## 2013-09-07 DIAGNOSIS — G729 Myopathy, unspecified: Secondary | ICD-10-CM | POA: Diagnosis not present

## 2013-10-13 ENCOUNTER — Encounter: Payer: Self-pay | Admitting: *Deleted

## 2013-11-02 ENCOUNTER — Encounter: Payer: Self-pay | Admitting: Internal Medicine

## 2013-11-02 DIAGNOSIS — N139 Obstructive and reflux uropathy, unspecified: Secondary | ICD-10-CM | POA: Diagnosis not present

## 2013-11-02 DIAGNOSIS — N3941 Urge incontinence: Secondary | ICD-10-CM | POA: Diagnosis not present

## 2013-11-02 DIAGNOSIS — N401 Enlarged prostate with lower urinary tract symptoms: Secondary | ICD-10-CM | POA: Diagnosis not present

## 2013-11-02 DIAGNOSIS — R3915 Urgency of urination: Secondary | ICD-10-CM | POA: Diagnosis not present

## 2013-11-21 ENCOUNTER — Telehealth: Payer: Self-pay | Admitting: Interventional Cardiology

## 2013-11-21 NOTE — Telephone Encounter (Signed)
New message    Patient having eye lid surgery on 6/10 . Can he stop ASA 81 mg prior to procedure.

## 2013-11-22 NOTE — Telephone Encounter (Signed)
Will route to Dr.Smith

## 2013-11-23 NOTE — Telephone Encounter (Signed)
Ok

## 2013-11-24 DIAGNOSIS — H02409 Unspecified ptosis of unspecified eyelid: Secondary | ICD-10-CM | POA: Diagnosis not present

## 2013-11-24 NOTE — Telephone Encounter (Signed)
pt adv Ok to [employer] for eye lid sx.per Dr.Smith pt verbalized understanding.

## 2014-01-03 ENCOUNTER — Encounter: Payer: Medicare Other | Admitting: Internal Medicine

## 2014-01-04 ENCOUNTER — Encounter: Payer: Self-pay | Admitting: Internal Medicine

## 2014-01-04 ENCOUNTER — Ambulatory Visit (INDEPENDENT_AMBULATORY_CARE_PROVIDER_SITE_OTHER): Payer: Medicare Other | Admitting: Internal Medicine

## 2014-01-04 VITALS — BP 159/79 | HR 68 | Ht 66.0 in | Wt 160.0 lb

## 2014-01-04 DIAGNOSIS — I4891 Unspecified atrial fibrillation: Secondary | ICD-10-CM | POA: Diagnosis not present

## 2014-01-04 DIAGNOSIS — R55 Syncope and collapse: Secondary | ICD-10-CM | POA: Diagnosis not present

## 2014-01-04 DIAGNOSIS — I480 Paroxysmal atrial fibrillation: Secondary | ICD-10-CM

## 2014-01-04 DIAGNOSIS — I2589 Other forms of chronic ischemic heart disease: Secondary | ICD-10-CM | POA: Diagnosis not present

## 2014-01-04 DIAGNOSIS — I495 Sick sinus syndrome: Secondary | ICD-10-CM

## 2014-01-04 DIAGNOSIS — Z950 Presence of cardiac pacemaker: Secondary | ICD-10-CM

## 2014-01-04 DIAGNOSIS — I2510 Atherosclerotic heart disease of native coronary artery without angina pectoris: Secondary | ICD-10-CM

## 2014-01-04 DIAGNOSIS — I255 Ischemic cardiomyopathy: Secondary | ICD-10-CM

## 2014-01-04 LAB — MDC_IDC_ENUM_SESS_TYPE_INCLINIC
Battery Remaining Longevity: 114 mo
Battery Voltage: 2.95 V
Brady Statistic RA Percent Paced: 97 %
Date Time Interrogation Session: 20150722151914
Implantable Pulse Generator Model: 2110
Implantable Pulse Generator Serial Number: 7350984
Lead Channel Impedance Value: 525 Ohm
Lead Channel Pacing Threshold Amplitude: 0.75 V
Lead Channel Pacing Threshold Amplitude: 0.75 V
Lead Channel Pacing Threshold Pulse Width: 0.4 ms
Lead Channel Pacing Threshold Pulse Width: 0.4 ms
Lead Channel Pacing Threshold Pulse Width: 0.6 ms
Lead Channel Sensing Intrinsic Amplitude: 10.3 mV
Lead Channel Setting Sensing Sensitivity: 2 mV
MDC IDC MSMT LEADCHNL RA IMPEDANCE VALUE: 450 Ohm
MDC IDC MSMT LEADCHNL RA SENSING INTR AMPL: 4.2 mV
MDC IDC MSMT LEADCHNL RV PACING THRESHOLD AMPLITUDE: 1.25 V
MDC IDC MSMT LEADCHNL RV PACING THRESHOLD AMPLITUDE: 1.25 V
MDC IDC MSMT LEADCHNL RV PACING THRESHOLD PULSEWIDTH: 0.6 ms
MDC IDC SET LEADCHNL RA PACING AMPLITUDE: 2 V
MDC IDC SET LEADCHNL RV PACING AMPLITUDE: 2.5 V
MDC IDC SET LEADCHNL RV PACING PULSEWIDTH: 0.6 ms
MDC IDC STAT BRADY RV PERCENT PACED: 0.11 %

## 2014-01-04 NOTE — Patient Instructions (Addendum)
Remote monitoring is used to monitor your pacemaker from home. This monitoring reduces the number of office visits required to check your device to one time per year. It allows [place] to keep an eye on the functioning of your device to ensure it is working properly. You are scheduled for a device check from home on 04-10-2014. You may send your transmission at any time that day. If you have a wireless device, the transmission will be sent automatically. After your physician reviews your transmission, you will receive a postcard with your next transmission date.  Your physician wants you to follow-up in: 6 months with Dr. Tamala Julian.   You will receive a reminder letter in the mail two months in advance. If you don't receive a letter, please call our office to schedule the follow-up appointment. Your physician wants you to follow-up in: 12 months with Dr. Caryl Comes.   You will receive a reminder letter in the mail two months in advance. If you don't receive a letter, please call our office to schedule the follow-up appointment.  Your physician recommends that you continue on your current medications as directed. Please refer to the Current Medication list given to you today.

## 2014-01-04 NOTE — Progress Notes (Signed)
Patient Care Team: Josetta Huddle, MD as PCP - General (Internal Medicine)   HPI  Edward Cannon is a 80 y.o. male Seen in followup for a pacemaker implanted 7/13 for sinus node dysfunction in the setting of ischemic heart disease with prior bypass surgery and normal left ventricular function. He also has a history of atrial fibrillation.   He was having syncope. Event recorder demonstrated a symptomatic pauses of greater than 3 seconds. He also has symptomatic bradycardia which prompted the implantation.    : He had syncope in the fall. This happened on 2 occasions. He had worn with of episodes.  Past Medical History  Diagnosis Date  . Hypertension   . Hyperlipidemia   . Microscopic hematuria   . Coronary artery disease S/P CABG   . Atrial fibrillation   . Bouchard nodes (DJD hand)   . Hx of colonic polyps   . Ventral hernia     Asymptomatic  . Diverticulosis     Wtih Anatomotic friability on colonscopy  . Urinary urgency     With Incontinence  . BPH (benign prostatic hypertrophy)   . 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 Syncope     Recurrent, second to Bradycardia  . Sinus node dysfunction   . Anginal pain 2005  . Pacemaker   . History of bronchitis winter 2012  . H/O scarlet fever 1936    Past Surgical History  Procedure Laterality Date  . [place]      Obtuse marginal branch of the left circumflex   . Rima      to the LAD  . Svg  2005    to posterior lateral branch of the RCA  . Low anterior bowel resection      for large villous  . Colon resection  ~ 2005  . Coronary artery bypass graft  ~ 2009    CABG X3  . Mastoidectomy  1930  . Cardiac catheterization    . Colon surgery    . Insert / replace / remove pacemaker  12/22/11    Current Outpatient Prescriptions  Medication Sig Dispense Refill  . Aspirin 81 MG EC tablet Take 81 mg by mouth every evening.       . cholecalciferol (VITAMIN D) 1000 UNITS tablet Take  1,000 Units by mouth every other day.      . cholestyramine (QUESTRAN) 4 G packet Take 1 packet by mouth 2 (two) times daily. Take in 4 oz orange juice followed by 8 oz water      . Multiple Vitamin (MULTIVITAMIN) tablet Take 1 tablet by mouth every other day. Without iron      . nitroGLYCERIN (NITROSTAT) 0.4 MG SL tablet Place 0.4 mg under the tongue every 5 (five) minutes x 3 doses as needed. For chest pain      . Tamsulosin HCl (FLOMAX) 0.4 MG CAPS Take 0.4 mg by mouth every morning.       . valsartan-hydrochlorothiazide (DIOVAN-HCT) 160-25 MG per tablet 0.5 tablets daily.       . vitamin E (VITAMIN E) 400 UNIT capsule Take 400 Units by mouth every morning.       . vitamin k 100 MCG tablet Take 50 mcg by mouth once a week. Mondays       No current facility-administered medications for this visit.    Allergies  Allergen Reactions  . Crestor [Rosuvastatin] Other (See Comments)    Myalgias  .  Hygroton [Chlorthalidone] Other (See Comments)    Syncope  . Lipitor [Atorvastatin] Other (See Comments)    Dizziness  . Metamucil [Psyllium] Other (See Comments)    Syncope  . Mevacor [Lovastatin] Other (See Comments)    Muscle cramps  . Pravastatin Other (See Comments)    Myalgias  . Vytorin [Ezetimibe-Simvastatin] Other (See Comments)    Myalgias  . Zetia [Ezetimibe] Other (See Comments)    Myalgias    Review of Systems negative except from HPI and PMH  Physical Exam BP 159/79  Pulse 68  Ht 5\' 6"  (1.676 m)  Wt 160 lb (72.576 kg)  BMI 25.84 kg/m2 Well developed and well nourished in no acute distress HENT normal E scleral and icterus clear Neck Supple JVP flat; carotids brisk and full Clear to ausculation Device pocket well healed; without hematoma or erythema.  There is no tethering  *Regular rate and rhythm, no murmurs gallops or rub Soft with active bowel sounds No clubbing cyanosis  Edema Alert and oriented, grossly normal motor and sensory function Skin Warm and  Dry  ECG  Atrial pacing 20/12/41  Assessment and  Plan  Sinus node dysfunction  Hypertension  Syncope  Neurally mediated  Pacemaker  St Jude   Stable [id number]  While his chart says he has atrial fibrillation this is not documented in his device. He is not on anticoagulation. This is appropriate.  Syncope continues to sound neurally mediated. I've told him, that recumbency upon, awareness of the prodrome is key  Blood pressure is moderately elevated. It has not been so at home. We will follow along.

## 2014-01-20 DIAGNOSIS — H35389 Toxic maculopathy, unspecified eye: Secondary | ICD-10-CM | POA: Diagnosis not present

## 2014-03-07 DIAGNOSIS — N3941 Urge incontinence: Secondary | ICD-10-CM | POA: Diagnosis not present

## 2014-03-07 DIAGNOSIS — R351 Nocturia: Secondary | ICD-10-CM | POA: Diagnosis not present

## 2014-03-21 DIAGNOSIS — S22009A Unspecified fracture of unspecified thoracic vertebra, initial encounter for closed fracture: Secondary | ICD-10-CM | POA: Diagnosis not present

## 2014-04-05 DIAGNOSIS — R3915 Urgency of urination: Secondary | ICD-10-CM | POA: Diagnosis not present

## 2014-04-05 DIAGNOSIS — N3289 Other specified disorders of bladder: Secondary | ICD-10-CM | POA: Diagnosis not present

## 2014-04-05 DIAGNOSIS — N401 Enlarged prostate with lower urinary tract symptoms: Secondary | ICD-10-CM | POA: Diagnosis not present

## 2014-04-05 DIAGNOSIS — N3941 Urge incontinence: Secondary | ICD-10-CM | POA: Diagnosis not present

## 2014-04-10 ENCOUNTER — Encounter: Payer: Self-pay | Admitting: Internal Medicine

## 2014-04-10 ENCOUNTER — Ambulatory Visit (INDEPENDENT_AMBULATORY_CARE_PROVIDER_SITE_OTHER): Payer: Medicare Other | Admitting: *Deleted

## 2014-04-10 DIAGNOSIS — I495 Sick sinus syndrome: Secondary | ICD-10-CM

## 2014-04-10 NOTE — Progress Notes (Signed)
Remote pacemaker transmission.   

## 2014-04-12 LAB — MDC_IDC_ENUM_SESS_TYPE_REMOTE
Battery Remaining Longevity: 87 mo
Battery Remaining Percentage: 74 %
Brady Statistic AP VP Percent: 1 %
Brady Statistic AP VS Percent: 97 %
Brady Statistic AS VS Percent: 3.1 %
Brady Statistic RA Percent Paced: 97 %
Brady Statistic RV Percent Paced: 1 %
Date Time Interrogation Session: 20151026183401
Implantable Pulse Generator Model: 2110
Lead Channel Impedance Value: 410 Ohm
Lead Channel Pacing Threshold Amplitude: 0.75 V
Lead Channel Pacing Threshold Amplitude: 1.25 V
Lead Channel Pacing Threshold Pulse Width: 0.4 ms
Lead Channel Pacing Threshold Pulse Width: 0.6 ms
Lead Channel Sensing Intrinsic Amplitude: 4.1 mV
Lead Channel Sensing Intrinsic Amplitude: 9.1 mV
Lead Channel Setting Pacing Amplitude: 2 V
Lead Channel Setting Pacing Pulse Width: 0.6 ms
Lead Channel Setting Sensing Sensitivity: 2 mV
MDC IDC MSMT BATTERY VOLTAGE: 2.95 V
MDC IDC MSMT LEADCHNL RV IMPEDANCE VALUE: 460 Ohm
MDC IDC PG SERIAL: 7350984
MDC IDC SET LEADCHNL RV PACING AMPLITUDE: 2.5 V
MDC IDC STAT BRADY AS VP PERCENT: 1 %

## 2014-04-19 ENCOUNTER — Encounter: Payer: Self-pay | Admitting: Cardiology

## 2014-05-25 ENCOUNTER — Encounter (HOSPITAL_COMMUNITY): Payer: Self-pay | Admitting: Internal Medicine

## 2014-07-12 ENCOUNTER — Ambulatory Visit (INDEPENDENT_AMBULATORY_CARE_PROVIDER_SITE_OTHER): Payer: Medicare Other | Admitting: *Deleted

## 2014-07-12 DIAGNOSIS — I495 Sick sinus syndrome: Secondary | ICD-10-CM

## 2014-07-12 NOTE — Progress Notes (Signed)
Remote pacemaker transmission.   

## 2014-07-17 LAB — MDC_IDC_ENUM_SESS_TYPE_REMOTE
Battery Remaining Longevity: 88 mo
Battery Remaining Percentage: 74 %
Brady Statistic AP VP Percent: 1 %
Brady Statistic AP VS Percent: 97 %
Brady Statistic AS VP Percent: 1 %
Brady Statistic AS VS Percent: 3 %
Brady Statistic RA Percent Paced: 97 %
Date Time Interrogation Session: 20160127163248
Lead Channel Impedance Value: 410 Ohm
Lead Channel Sensing Intrinsic Amplitude: 4 mV
Lead Channel Sensing Intrinsic Amplitude: 9.1 mV
Lead Channel Setting Pacing Amplitude: 2 V
Lead Channel Setting Pacing Amplitude: 2.5 V
MDC IDC MSMT BATTERY VOLTAGE: 2.95 V
MDC IDC MSMT LEADCHNL RV IMPEDANCE VALUE: 480 Ohm
MDC IDC PG SERIAL: 7350984
MDC IDC SET LEADCHNL RV PACING PULSEWIDTH: 0.6 ms
MDC IDC SET LEADCHNL RV SENSING SENSITIVITY: 2 mV
MDC IDC STAT BRADY RV PERCENT PACED: 1 %

## 2014-07-25 ENCOUNTER — Encounter: Payer: Self-pay | Admitting: Cardiology

## 2014-07-28 ENCOUNTER — Encounter: Payer: Self-pay | Admitting: Internal Medicine

## 2014-07-28 ENCOUNTER — Ambulatory Visit (INDEPENDENT_AMBULATORY_CARE_PROVIDER_SITE_OTHER): Payer: Medicare Other | Admitting: Interventional Cardiology

## 2014-07-28 ENCOUNTER — Encounter: Payer: Self-pay | Admitting: Interventional Cardiology

## 2014-07-28 VITALS — BP 172/90 | HR 80 | Ht 66.0 in | Wt 151.0 lb

## 2014-07-28 DIAGNOSIS — Z950 Presence of cardiac pacemaker: Secondary | ICD-10-CM

## 2014-07-28 DIAGNOSIS — I25810 Atherosclerosis of coronary artery bypass graft(s) without angina pectoris: Secondary | ICD-10-CM | POA: Diagnosis not present

## 2014-07-28 DIAGNOSIS — I495 Sick sinus syndrome: Secondary | ICD-10-CM | POA: Diagnosis not present

## 2014-07-28 DIAGNOSIS — I480 Paroxysmal atrial fibrillation: Secondary | ICD-10-CM

## 2014-07-28 NOTE — Patient Instructions (Signed)
Your physician wants you to follow-up in:  12 months.  You will receive a reminder letter in the mail two months in advance. If you don't receive a letter, please call our office to schedule the follow-up appointment.   

## 2014-07-28 NOTE — Progress Notes (Signed)
Patient ID: Edward Cannon, male   DOB: 1925-08-15, 81 y.o.   MRN: [id number]    Cardiology Office Note   Date:  07/28/2014   ID:  [name], DOB 1926/01/31, MRN [id number]  PCP:  Henrine Screws, MD  Cardiologist:   Sinclair Grooms, MD   Chief Complaint  Patient presents with  . weakness    feels tired      History of Present Illness: Edward Cannon is a 81 y.o. male who presents for SSS and CAD. Has syncope occasionally, with prodrome. He denies angina and dyspnea.  He has not had syncope or trauma to his head. He is worried that Dr. Caryl Comes reprogrammed his pacemaker to a heart rate of near 70. He will also to be slower.   He has had one episode of near fainting since I last saw him over one year ago.    Past Medical History  Diagnosis Date  . Hypertension   . Hyperlipidemia   . Microscopic hematuria   . Coronary artery disease S/P CABG   . Atrial fibrillation   . Bouchard nodes (DJD hand)   . Hx of colonic polyps   . Ventral hernia     Asymptomatic  . Diverticulosis     Wtih Anatomotic friability on colonscopy  . Urinary urgency     With Incontinence  . BPH (benign prostatic hypertrophy)   . 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 Syncope     Recurrent, second to Bradycardia  . Sinus node dysfunction   . Anginal pain 2005  . Pacemaker   . History of bronchitis winter 2012  . H/O scarlet fever 1936    Past Surgical History  Procedure Laterality Date  . [place]      Obtuse marginal branch of the left circumflex   . Rima      to the LAD  . Svg  2005    to posterior lateral branch of the RCA  . Low anterior bowel resection      for large villous  . Colon resection  ~ 2005  . Coronary artery bypass graft  ~ 2009    CABG X3  . Mastoidectomy  1930  . Cardiac catheterization    . Colon surgery    . Insert / replace / remove pacemaker  12/22/11  . Permanent pacemaker insertion N/A 12/22/2011   Procedure: PERMANENT PACEMAKER INSERTION;  Surgeon: Deboraha Sprang, MD;  Location: [facility] CATH LAB;  Service: Cardiovascular;  Laterality: N/A;     Current Outpatient Prescriptions  Medication Sig Dispense Refill  . Aspirin 81 MG EC tablet Take 81 mg by mouth every evening.     . cholecalciferol (VITAMIN D) 1000 UNITS tablet Take 1,000 Units by mouth every other day.    . cholestyramine (QUESTRAN) 4 G packet Take 1 packet by mouth 2 (two) times daily. Take in 4 oz orange juice followed by 8 oz water    . Multiple Vitamin (MULTIVITAMIN) tablet Take 1 tablet by mouth every other day. Without iron    . nitroGLYCERIN (NITROSTAT) 0.4 MG SL tablet Place 0.4 mg under the tongue every 5 (five) minutes x 3 doses as needed. For chest pain    . Tamsulosin HCl (FLOMAX) 0.4 MG CAPS Take 0.4 mg by mouth every morning.     . valsartan-hydrochlorothiazide (DIOVAN-HCT) 160-25 MG per tablet 0.5 tablets daily.     . vitamin E (VITAMIN E) 400 UNIT capsule Take  400 Units by mouth every morning.     . vitamin k 100 MCG tablet Take 50 mcg by mouth once a week. Mondays     No current facility-administered medications for this visit.    Allergies:   Crestor; Hygroton; Lipitor; Metamucil; Mevacor; Pravastatin; Vytorin; and Zetia    Social History:  The patient  reports that he has never smoked. He has never used smokeless tobacco. He reports that he drinks about 2.4 oz of alcohol per week. He reports that he does not use illicit drugs.   Family History:  The patient's family history includes COPD in his father; Coronary artery disease in his brother; Diabetes in his mother; Heart disease in his mother; Heart failure in his brother; Other in his brother; Pancreatic cancer in his brother; Prostate cancer in his father.    ROS:  Please see the history of present illness.   Otherwise, review of systems are positive for none.   All other systems are reviewed and negative.    PHYSICAL EXAM: VS:  BP 172/90 mmHg  Pulse 80   Ht 5\' 6"  (1.676 m)  Wt 151 lb (68.493 kg)  BMI 24.38 kg/m2 , BMI Body mass index is 24.38 kg/(m^2). GEN: Well nourished, well developed, in no acute distress HEENT: normal Neck: no JVD, carotid bruits, or masses Cardiac: RRR; no murmurs, rubs, or gallops,no edema  Respiratory:  clear to auscultation bilaterally, normal work of breathing GI: soft, nontender, nondistended, + BS MS: no deformity or atrophy Skin: warm and dry, no rash Neuro:  Strength and sensation are intact Psych: euthymic mood, full affect   EKG:  EKG is not ordered today. The ekg ordered today demonstrates .   Recent Labs: No results found for requested labs within last 365 days.    Lipid Panel No results found for: CHOL, TRIG, HDL, CHOLHDL, VLDL, LDLCALC, LDLDIRECT    Wt Readings from Last 3 Encounters:  07/28/14 151 lb (68.493 kg)  01/04/14 160 lb (72.576 kg)  07/26/13 160 lb (72.576 kg)      Other studies Reviewed: Additional studies/ records that were reviewed today include:. Review of the above records demonstrates:    ASSESSMENT AND PLAN:  1.   Coronary atherosclerotic heart disease with prior coronary bypass grafting in no ongoing anginal complaints. 2.   Sick sinus syndrome with permanent pacemaker and normal function 3. Neurally mediated syncope 4. Prior history of atrial fibrillation    Current medicines are reviewed at length with the patient today.  The patient does not have concerns regarding medicines.  The following changes have been made:  no change  Labs/ tests ordered today include:  No orders of the defined types were placed in this encounter.     Disposition:   FU with  Linard Millers in  12  months   Signed, Sinclair Grooms, MD  07/28/2014 11:55 AM    [address], [place], [place]  [address] Phone: [phone]; Fax: [phone]

## 2014-09-12 DIAGNOSIS — N400 Enlarged prostate without lower urinary tract symptoms: Secondary | ICD-10-CM | POA: Diagnosis not present

## 2014-09-12 DIAGNOSIS — I10 Essential (primary) hypertension: Secondary | ICD-10-CM | POA: Diagnosis not present

## 2014-09-12 DIAGNOSIS — E782 Mixed hyperlipidemia: Secondary | ICD-10-CM | POA: Diagnosis not present

## 2014-09-12 DIAGNOSIS — Z950 Presence of cardiac pacemaker: Secondary | ICD-10-CM | POA: Diagnosis not present

## 2014-09-12 DIAGNOSIS — H6123 Impacted cerumen, bilateral: Secondary | ICD-10-CM | POA: Diagnosis not present

## 2014-09-12 DIAGNOSIS — I2510 Atherosclerotic heart disease of native coronary artery without angina pectoris: Secondary | ICD-10-CM | POA: Diagnosis not present

## 2014-09-12 DIAGNOSIS — E559 Vitamin D deficiency, unspecified: Secondary | ICD-10-CM | POA: Diagnosis not present

## 2014-09-12 DIAGNOSIS — R29898 Other symptoms and signs involving the musculoskeletal system: Secondary | ICD-10-CM | POA: Diagnosis not present

## 2014-09-12 DIAGNOSIS — R269 Unspecified abnormalities of gait and mobility: Secondary | ICD-10-CM | POA: Diagnosis not present

## 2014-09-12 DIAGNOSIS — H6120 Impacted cerumen, unspecified ear: Secondary | ICD-10-CM | POA: Diagnosis not present

## 2014-09-12 DIAGNOSIS — R32 Unspecified urinary incontinence: Secondary | ICD-10-CM | POA: Diagnosis not present

## 2014-09-12 DIAGNOSIS — Z1389 Encounter for screening for other disorder: Secondary | ICD-10-CM | POA: Diagnosis not present

## 2014-09-12 DIAGNOSIS — Z0001 Encounter for general adult medical examination with abnormal findings: Secondary | ICD-10-CM | POA: Diagnosis not present

## 2014-09-28 DIAGNOSIS — M6281 Muscle weakness (generalized): Secondary | ICD-10-CM | POA: Diagnosis not present

## 2014-09-28 DIAGNOSIS — T466X1A Poisoning by antihyperlipidemic and antiarteriosclerotic drugs, accidental (unintentional), initial encounter: Secondary | ICD-10-CM | POA: Diagnosis not present

## 2014-09-28 DIAGNOSIS — G720 Drug-induced myopathy: Secondary | ICD-10-CM | POA: Diagnosis not present

## 2014-10-02 DIAGNOSIS — G720 Drug-induced myopathy: Secondary | ICD-10-CM | POA: Diagnosis not present

## 2014-10-02 DIAGNOSIS — M6281 Muscle weakness (generalized): Secondary | ICD-10-CM | POA: Diagnosis not present

## 2014-10-02 DIAGNOSIS — T466X1A Poisoning by antihyperlipidemic and antiarteriosclerotic drugs, accidental (unintentional), initial encounter: Secondary | ICD-10-CM | POA: Diagnosis not present

## 2014-10-04 DIAGNOSIS — M6281 Muscle weakness (generalized): Secondary | ICD-10-CM | POA: Diagnosis not present

## 2014-10-04 DIAGNOSIS — I714 Abdominal aortic aneurysm, without rupture, unspecified: Secondary | ICD-10-CM

## 2014-10-04 DIAGNOSIS — T466X1A Poisoning by antihyperlipidemic and antiarteriosclerotic drugs, accidental (unintentional), initial encounter: Secondary | ICD-10-CM | POA: Diagnosis not present

## 2014-10-04 DIAGNOSIS — G720 Drug-induced myopathy: Secondary | ICD-10-CM | POA: Diagnosis not present

## 2014-10-04 HISTORY — DX: Abdominal aortic aneurysm, without rupture: I71.4

## 2014-10-04 HISTORY — DX: Abdominal aortic aneurysm, without rupture, unspecified: I71.40

## 2014-10-05 ENCOUNTER — Ambulatory Visit: Payer: Self-pay | Admitting: Neurology

## 2014-10-09 ENCOUNTER — Encounter: Payer: Self-pay | Admitting: Vascular Surgery

## 2014-10-09 ENCOUNTER — Other Ambulatory Visit: Payer: Self-pay

## 2014-10-09 ENCOUNTER — Ambulatory Visit (INDEPENDENT_AMBULATORY_CARE_PROVIDER_SITE_OTHER): Payer: Medicare Other | Admitting: Neurology

## 2014-10-09 ENCOUNTER — Encounter: Payer: Self-pay | Admitting: Neurology

## 2014-10-09 ENCOUNTER — Ambulatory Visit: Payer: Self-pay | Admitting: Neurology

## 2014-10-09 VITALS — BP 162/82 | HR 73 | Ht 65.0 in | Wt 158.4 lb

## 2014-10-09 DIAGNOSIS — R351 Nocturia: Secondary | ICD-10-CM | POA: Diagnosis not present

## 2014-10-09 DIAGNOSIS — N3289 Other specified disorders of bladder: Secondary | ICD-10-CM | POA: Diagnosis not present

## 2014-10-09 DIAGNOSIS — I25810 Atherosclerosis of coronary artery bypass graft(s) without angina pectoris: Secondary | ICD-10-CM

## 2014-10-09 DIAGNOSIS — R3915 Urgency of urination: Secondary | ICD-10-CM | POA: Diagnosis not present

## 2014-10-09 DIAGNOSIS — E538 Deficiency of other specified B group vitamins: Secondary | ICD-10-CM | POA: Diagnosis not present

## 2014-10-09 DIAGNOSIS — R269 Unspecified abnormalities of gait and mobility: Secondary | ICD-10-CM | POA: Insufficient documentation

## 2014-10-09 DIAGNOSIS — N3941 Urge incontinence: Secondary | ICD-10-CM | POA: Diagnosis not present

## 2014-10-09 DIAGNOSIS — R29898 Other symptoms and signs involving the musculoskeletal system: Secondary | ICD-10-CM

## 2014-10-09 HISTORY — DX: Unspecified abnormalities of gait and mobility: R26.9

## 2014-10-09 HISTORY — DX: Other symptoms and signs involving the musculoskeletal system: R29.898

## 2014-10-09 NOTE — Progress Notes (Signed)
Reason for visit: Gait disorder  Referring physician: Dr. Mertha Finders  Edward Cannon is a 81 y.o. male  History of present illness:  Edward Cannon is an 81 year old right-handed white male with a history of a gait disorder that has been present for least 3 years. The patient relates the problem to the use of statin drugs on a chronic basis. The patient indicates that he has been through 6 different statin type medications, all of which resulted in muscle discomfort in the legs. Around 3 years ago, his statin medication dosing was increased, and he developed pain in the legs associated with weakness. The weakness in the legs has never completely gotten better. He has had some gait instability, and occasional falls. He has difficulty arising from a seated position without pushing off of his arms. He has trouble climbing stairs. He uses a cane for ambulation currently. He reports no numbness in the extremities. He denies any weakness of the arms. The denies any memory issues, or headaches or dizziness. He has had some intermittent episodes of syncope that have occurred on average once a year. He has a pacemaker in place currently. He reports some urinary urgency and occasional incontinence, and some issues with constipation. He does not believe that his gait problems have been progressive. He comes to this office for an evaluation.  Past Medical History  Diagnosis Date  . Hypertension   . Hyperlipidemia   . Microscopic hematuria   . Coronary artery disease S/P CABG   . Atrial fibrillation   . Bouchard nodes (DJD hand)   . Hx of colonic polyps   . Ventral hernia     Asymptomatic  . Diverticulosis     Wtih Anatomotic friability on colonscopy  . Urinary urgency     With Incontinence  . BPH (benign prostatic hypertrophy)   . 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 Syncope     Recurrent, second to Bradycardia  . Sinus node dysfunction   . Anginal pain  2005  . Pacemaker   . History of bronchitis winter 2012  . H/O scarlet fever 1936  . Leg weakness, bilateral 10/09/2014  . Abnormality of gait 10/09/2014  . AAA (abdominal aortic aneurysm) 10/04/14    Per  Dr. Viviana Simpler    Past Surgical History  Procedure Laterality Date  . [place]      Obtuse marginal branch of the left circumflex   . Rima      to the LAD  . Svg  2005    to posterior lateral branch of the RCA  . Low anterior bowel resection      for large villous  . Colon resection  ~ 2005  . Coronary artery bypass graft  ~ 2009    CABG X3  . Mastoidectomy  1930  . Cardiac catheterization    . Colon surgery    . Insert / replace / remove pacemaker  12/22/11  . Permanent pacemaker insertion N/A 12/22/2011    Procedure: PERMANENT PACEMAKER INSERTION;  Surgeon: Deboraha Sprang, MD;  Location: [facility] CATH LAB;  Service: Cardiovascular;  Laterality: N/A;  . Mastoid surgery    . Cataract extraction Bilateral   . Eye surgery Bilateral 2012    Family History  Problem Relation Age of Onset  . Diabetes Mother   . Heart disease Mother     Pacemaker  . CAD Mother   . COPD Father   . Prostate  cancer Father   . Pancreatic cancer Brother   . Other Brother     CABG  . Heart failure Brother   . Coronary artery disease Brother     Social history:  reports that he has never smoked. He has never used smokeless tobacco. He reports that he drinks about 2.4 oz of alcohol per week. He reports that he does not use illicit drugs.  Medications:  Prior to Admission medications   Medication Sig Start Date End Date Taking? Authorizing Provider  Aspirin 81 MG EC tablet Take 81 mg by mouth every evening.    Yes Historical Provider, MD  cholecalciferol (VITAMIN D) 1000 UNITS tablet Take 1,000 Units by mouth every other day.   Yes Historical Provider, MD  cholestyramine Lucrezia Starch) 4 G packet Take 1 packet by mouth 2 (two) times daily. Take in 4 oz orange juice followed by 8 oz water   Yes Historical  Provider, MD  Multiple Vitamin (MULTIVITAMIN) tablet Take 1 tablet by mouth every other day. Without iron   Yes Historical Provider, MD  nitroGLYCERIN (NITROSTAT) 0.4 MG SL tablet Place 0.4 mg under the tongue every 5 (five) minutes x 3 doses as needed. For chest pain   Yes Historical Provider, MD  Tamsulosin HCl (FLOMAX) 0.4 MG CAPS Take 0.4 mg by mouth every morning.  11/21/11  Yes Historical Provider, MD  valsartan-hydrochlorothiazide (DIOVAN-HCT) 160-25 MG per tablet 0.5 tablets daily.  07/05/13  Yes Historical Provider, MD  vitamin E (VITAMIN E) 400 UNIT capsule Take 400 Units by mouth every morning.    Yes Historical Provider, MD  vitamin k 100 MCG tablet Take 50 mcg by mouth once a week. Mondays   Yes Historical Provider, MD      Allergies  Allergen Reactions  . Crestor [Rosuvastatin] Other (See Comments)    Myalgias  . Hygroton [Chlorthalidone] Other (See Comments)    Syncope  . Lipitor [Atorvastatin] Other (See Comments)    Dizziness  . Metamucil [Psyllium] Other (See Comments)    Syncope  . Mevacor [Lovastatin] Other (See Comments)    Muscle cramps  . Pravastatin Other (See Comments)    Myalgias  . Vytorin [Ezetimibe-Simvastatin] Other (See Comments)    Myalgias  . Zetia [Ezetimibe] Other (See Comments)    Myalgias  . Toviaz [Fesoterodine Fumarate Er]     ROS:  Out of a complete 14 system review of symptoms, the patient complains only of the following symptoms, and all other reviewed systems are negative.  Ringing in the ears Snoring Incontinence of the bladder Passing out  Blood pressure 162/82, pulse 73, height 5\' 5"  (1.651 m), weight 158 lb 6.4 oz (71.85 kg).  Physical Exam  General: The patient is alert and cooperative at the time of the examination. The patient is moderately obese.  Eyes: Pupils are equal, round, and reactive to light. Discs are flat bilaterally.  Neck: The neck is supple, no carotid bruits are noted.  Respiratory: The respiratory  examination is clear.  Cardiovascular: The cardiovascular examination reveals a regular rate and rhythm, no obvious murmurs or rubs are noted.  Skin: Extremities are with 1+ edema bilaterally.  Neurologic Exam  Mental status: The patient is alert and oriented x 3 at the time of the examination. The patient has apparent normal recent and remote memory, with an apparently normal attention span and concentration ability.  Cranial nerves: Facial symmetry is present. There is good sensation of the face to pinprick and soft touch bilaterally. The strength of  the facial muscles and the muscles to head turning and shoulder shrug are normal bilaterally. Speech is well enunciated, no aphasia or dysarthria is noted. Extraocular movements are full. Visual fields are full. The tongue is midline, and the patient has symmetric elevation of the soft palate. No obvious hearing deficits are noted.  Motor: The motor testing reveals 5 over 5 strength of all 4 extremities, with exception of very slight proximal weakness in both legs.Kermit Balo symmetric motor tone is noted throughout.  Sensory: Sensory testing is intact to pinprick, soft touch, vibration sensation, and position sense on all 4 extremities. No evidence of extinction is noted.  Coordination: Cerebellar testing reveals good finger-nose-finger and heel-to-shin bilaterally.  Gait and station: Gait is wide-based, with short steps. The patient is unable to rise from a seated position with arms crossed. Tandem gait was not attempted. Romberg is negative. No drift is seen.  Reflexes: Deep tendon reflexes are symmetric bilaterally, with some decrease in the ankle jerk reflexes bilaterally. Toes are downgoing bilaterally.   Assessment/Plan:  1. Gait disturbance  The patient has developed some slight proximal muscle weakness in both legs. He has a wide-based shuffling type gait. The patient relates the weakness to the use of statin drugs. The patient will be  sent for blood work today, and he will have nerve conduction studies on both legs, EMG on at least one leg. If this study is unremarkable, we will pursue further testing to include a CAT scan of the brain and cervical spine. He will follow-up for the EMG.  Jill Alexanders MD 10/09/2014 8:36 PM  [facility] [address] [place], [place] [address]  Phone [phone] Fax [phone]

## 2014-10-09 NOTE — Patient Instructions (Signed)

## 2014-10-10 ENCOUNTER — Ambulatory Visit: Payer: Self-pay | Admitting: Neurology

## 2014-10-10 DIAGNOSIS — T466X1A Poisoning by antihyperlipidemic and antiarteriosclerotic drugs, accidental (unintentional), initial encounter: Secondary | ICD-10-CM | POA: Diagnosis not present

## 2014-10-10 DIAGNOSIS — M6281 Muscle weakness (generalized): Secondary | ICD-10-CM | POA: Diagnosis not present

## 2014-10-10 DIAGNOSIS — G720 Drug-induced myopathy: Secondary | ICD-10-CM | POA: Diagnosis not present

## 2014-10-10 LAB — SEDIMENTATION RATE: SED RATE: 9 mm/h (ref 0–30)

## 2014-10-10 LAB — RPR: RPR Ser Ql: NONREACTIVE

## 2014-10-10 LAB — CK: CK TOTAL: 75 U/L (ref 24–204)

## 2014-10-10 LAB — ANA W/REFLEX: Anti Nuclear Antibody(ANA): NEGATIVE

## 2014-10-10 LAB — ACETYLCHOLINE RECEPTOR, BINDING: ACHR BINDING AB, SERUM: 0.05 nmol/L (ref 0.00–0.24)

## 2014-10-10 LAB — VITAMIN B12: Vitamin B-12: 1243 pg/mL — ABNORMAL HIGH (ref 211–946)

## 2014-10-10 LAB — ANGIOTENSIN CONVERTING ENZYME: ANGIO CONVERT ENZYME: 33 U/L (ref 14–82)

## 2014-10-10 LAB — RHEUMATOID FACTOR: Rhuematoid fact SerPl-aCnc: <7 IU/mL (ref 0.0–13.9)

## 2014-10-11 ENCOUNTER — Other Ambulatory Visit: Payer: Self-pay | Admitting: *Deleted

## 2014-10-11 ENCOUNTER — Telehealth: Payer: Self-pay

## 2014-10-11 DIAGNOSIS — I714 Abdominal aortic aneurysm, without rupture, unspecified: Secondary | ICD-10-CM

## 2014-10-11 NOTE — Telephone Encounter (Signed)
Spoke to patient. Relayed results.

## 2014-10-11 NOTE — Telephone Encounter (Signed)
-----   Message from Kathrynn Ducking, MD sent at 10/10/2014  5:04 PM EDT -----  The blood work results are unremarkable. Please call the patient.  ----- Message -----    From: [facility] Lab Results In Interface    Sent: 10/10/2014   7:47 AM      To: Kathrynn Ducking, MD

## 2014-10-12 ENCOUNTER — Encounter: Payer: Self-pay | Admitting: Internal Medicine

## 2014-10-12 ENCOUNTER — Telehealth: Payer: Self-pay | Admitting: Cardiology

## 2014-10-12 ENCOUNTER — Ambulatory Visit (INDEPENDENT_AMBULATORY_CARE_PROVIDER_SITE_OTHER): Payer: Medicare Other | Admitting: *Deleted

## 2014-10-12 DIAGNOSIS — M6281 Muscle weakness (generalized): Secondary | ICD-10-CM | POA: Diagnosis not present

## 2014-10-12 DIAGNOSIS — I495 Sick sinus syndrome: Secondary | ICD-10-CM | POA: Diagnosis not present

## 2014-10-12 DIAGNOSIS — T466X1A Poisoning by antihyperlipidemic and antiarteriosclerotic drugs, accidental (unintentional), initial encounter: Secondary | ICD-10-CM | POA: Diagnosis not present

## 2014-10-12 DIAGNOSIS — G720 Drug-induced myopathy: Secondary | ICD-10-CM | POA: Diagnosis not present

## 2014-10-12 LAB — MDC_IDC_ENUM_SESS_TYPE_REMOTE
Battery Remaining Longevity: 87 mo
Battery Voltage: 2.95 V
Brady Statistic AP VS Percent: 97 %
Brady Statistic AS VP Percent: 1 %
Brady Statistic RA Percent Paced: 97 %
Brady Statistic RV Percent Paced: 1 %
Date Time Interrogation Session: 20160428184219
Implantable Pulse Generator Model: 2110
Lead Channel Impedance Value: 460 Ohm
Lead Channel Pacing Threshold Amplitude: 0.75 V
Lead Channel Pacing Threshold Amplitude: 1.25 V
Lead Channel Pacing Threshold Pulse Width: 0.4 ms
Lead Channel Pacing Threshold Pulse Width: 0.6 ms
Lead Channel Sensing Intrinsic Amplitude: 3.5 mV
Lead Channel Setting Sensing Sensitivity: 2 mV
MDC IDC MSMT BATTERY REMAINING PERCENTAGE: 74 %
MDC IDC MSMT LEADCHNL RA IMPEDANCE VALUE: 410 Ohm
MDC IDC MSMT LEADCHNL RV SENSING INTR AMPL: 10.3 mV
MDC IDC PG SERIAL: 7350984
MDC IDC SET LEADCHNL RA PACING AMPLITUDE: 2 V
MDC IDC SET LEADCHNL RV PACING AMPLITUDE: 2.5 V
MDC IDC SET LEADCHNL RV PACING PULSEWIDTH: 0.6 ms
MDC IDC STAT BRADY AP VP PERCENT: 1 %
MDC IDC STAT BRADY AS VS PERCENT: 2.7 %

## 2014-10-12 NOTE — Telephone Encounter (Signed)
Spoke with pt and reminded pt of remote transmission that is due today. Pt verbalized understanding.   

## 2014-10-12 NOTE — Progress Notes (Signed)
Remote pacemaker transmission.   

## 2014-10-16 DIAGNOSIS — M6281 Muscle weakness (generalized): Secondary | ICD-10-CM | POA: Diagnosis not present

## 2014-10-16 DIAGNOSIS — T466X1A Poisoning by antihyperlipidemic and antiarteriosclerotic drugs, accidental (unintentional), initial encounter: Secondary | ICD-10-CM | POA: Diagnosis not present

## 2014-10-16 DIAGNOSIS — G720 Drug-induced myopathy: Secondary | ICD-10-CM | POA: Diagnosis not present

## 2014-10-18 DIAGNOSIS — T466X1A Poisoning by antihyperlipidemic and antiarteriosclerotic drugs, accidental (unintentional), initial encounter: Secondary | ICD-10-CM | POA: Diagnosis not present

## 2014-10-18 DIAGNOSIS — G720 Drug-induced myopathy: Secondary | ICD-10-CM | POA: Diagnosis not present

## 2014-10-18 DIAGNOSIS — M6281 Muscle weakness (generalized): Secondary | ICD-10-CM | POA: Diagnosis not present

## 2014-10-19 ENCOUNTER — Encounter: Payer: Self-pay | Admitting: Vascular Surgery

## 2014-10-20 ENCOUNTER — Ambulatory Visit (INDEPENDENT_AMBULATORY_CARE_PROVIDER_SITE_OTHER): Payer: Medicare Other | Admitting: Vascular Surgery

## 2014-10-20 ENCOUNTER — Encounter (HOSPITAL_COMMUNITY): Payer: Self-pay

## 2014-10-20 ENCOUNTER — Ambulatory Visit (HOSPITAL_COMMUNITY)
Admission: RE | Admit: 2014-10-20 | Discharge: 2014-10-20 | Disposition: A | Discharge status: Home / Self Care | Payer: Medicare Other | Source: Ambulatory Visit | Attending: Vascular Surgery | Admitting: Vascular Surgery

## 2014-10-20 ENCOUNTER — Encounter: Payer: Self-pay | Admitting: Vascular Surgery

## 2014-10-20 VITALS — BP 182/86 | HR 68 | Resp 16 | Ht 64.5 in | Wt 150.0 lb

## 2014-10-20 DIAGNOSIS — I714 Abdominal aortic aneurysm, without rupture, unspecified: Secondary | ICD-10-CM

## 2014-10-20 DIAGNOSIS — I25810 Atherosclerosis of coronary artery bypass graft(s) without angina pectoris: Secondary | ICD-10-CM

## 2014-10-20 DIAGNOSIS — Z01810 Encounter for preprocedural cardiovascular examination: Secondary | ICD-10-CM

## 2014-10-20 DIAGNOSIS — Z01818 Encounter for other preprocedural examination: Secondary | ICD-10-CM | POA: Diagnosis not present

## 2014-10-20 DIAGNOSIS — R911 Solitary pulmonary nodule: Secondary | ICD-10-CM | POA: Diagnosis not present

## 2014-10-20 DIAGNOSIS — Z01811 Encounter for preprocedural respiratory examination: Secondary | ICD-10-CM | POA: Diagnosis not present

## 2014-10-20 LAB — VAS US AAA DUPLEX
ABAODAP: 2.47 cm
ABAODVEL: 2.5 cm/s
ABAOMVEL: 5.56 cm/s
Abdominal lt com iliac AP: 1.27 cm
Abdominal lt com iliac vel: 1.23 cm/s
Abdominal mid aorta AP: 5.5 cm
Abdominal prox aorta AP: 4.26 cm
Abdominal prox aorta vel: 4.22 cm/s
Abdominal rt com iliac AP: 1.34 cm
Abdominal rt com iliac vel: 1.48 cm/s

## 2014-10-20 LAB — CREATININE, SERUM
Creatinine, Ser: 1.22 mg/dL (ref 0.61–1.24)
GFR calc Af Amer: >60 mL/min (ref >60)
GFR calc non Af Amer: 52 mL/min — ABNORMAL LOW (ref >60)

## 2014-10-20 LAB — BUN: BUN: 17 mg/dL (ref 6–20)

## 2014-10-20 MED ORDER — IOHEXOL 350 MG/ML SOLN
100.0000 mL | Freq: Once | INTRAVENOUS | Status: AC | PRN
  Administered 2014-10-20: 100 mL via INTRAVENOUS

## 2014-10-20 NOTE — Progress Notes (Addendum)
Referred by: Josetta Huddle, MD [address] [place], [place] [address]  Reason for referral: AAA  History of Present Illness  The patient is a 81 y.o. (1926-04-08) male who presents with chief complaint: AAA on [facility].  Previous studies demonstrate an AAA, measuring 5.0 cm.  The patient does not have abdominal pain.  The patient has had back pain due to compression fractures and scoliosis.  The patient does not have history of embolic episodes from the AAA.  The patient's risk factors for AAA included: male sex and age.  The patient not smoke cigarettes and has never smoked.   Past Medical History  Diagnosis Date  . Hypertension   . Hyperlipidemia   . Microscopic hematuria   . Coronary artery disease S/P CABG   . Atrial fibrillation   . Bouchard nodes (DJD hand)   . Hx of colonic polyps   . Ventral hernia     Asymptomatic  . Diverticulosis     Wtih Anatomotic friability on colonscopy  . Urinary urgency     With Incontinence  . BPH (benign prostatic hypertrophy)   . 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 Syncope     Recurrent, second to Bradycardia  . Sinus node dysfunction   . Anginal pain 2005  . Pacemaker   . History of bronchitis winter 2012  . H/O scarlet fever 1936  . Leg weakness, bilateral 10/09/2014  . Abnormality of gait 10/09/2014  . AAA (abdominal aortic aneurysm) 10/04/14    Per  Dr. Viviana Simpler    Past Surgical History  Procedure Laterality Date  . [place]      Obtuse marginal branch of the left circumflex   . Rima      to the LAD  . Svg  2005    to posterior lateral branch of the RCA  . Low anterior bowel resection      for large villous  . Colon resection  ~ 2005  . Coronary artery bypass graft  ~ 2009    CABG X3  . Mastoidectomy  1930  . Cardiac catheterization    . Colon surgery    . Insert / replace / remove pacemaker  12/22/11  . Permanent pacemaker insertion N/A 12/22/2011    Procedure:  PERMANENT PACEMAKER INSERTION;  Surgeon: Deboraha Sprang, MD;  Location: [facility] CATH LAB;  Service: Cardiovascular;  Laterality: N/A;  . Mastoid surgery    . Cataract extraction Bilateral   . Eye surgery Bilateral 2012    History   Social History  . Marital Status: Married    Spouse Name: N/A  . Number of Children: 1  . Years of Education: PhD   Occupational History  . retired    Social History Main Topics  . Smoking status: Never Smoker   . Smokeless tobacco: Never Used  . Alcohol Use: 2.4 oz/week    4 Glasses of wine per week     Comment: 12/22/11 "I drink 2 oz red wine qd at the suggestion of my cardiologist"  . Drug Use: No  . Sexual Activity: No   Other Topics Concern  . Not on file   Social History Narrative   Patient is right handed.   Patient does not drink caffeine.    Family History  Problem Relation Age of Onset  . Diabetes Mother   . Heart disease Mother     Pacemaker  . CAD Mother   .  COPD Father   . Prostate cancer Father   . Pancreatic cancer Brother   . Other Brother     CABG  . Heart failure Brother   . Coronary artery disease Brother     Current Outpatient Prescriptions on File Prior to Visit  Medication Sig Dispense Refill  . AMLODIPINE BESYLATE PO Take 2.5 mg by mouth.    . Aspirin 81 MG EC tablet Take 81 mg by mouth every evening.     . cholecalciferol (VITAMIN D) 1000 UNITS tablet Take 1,000 Units by mouth every other day.    . cholestyramine (QUESTRAN) 4 G packet Take 1 packet by mouth 2 (two) times daily. Take in 4 oz orange juice followed by 8 oz water    . clotrimazole-betamethasone (LOTRISONE) cream Apply 1 application topically 2 (two) times daily.    . Multiple Vitamin (MULTIVITAMIN) tablet Take 1 tablet by mouth every other day. Without iron    . nitroGLYCERIN (NITROSTAT) 0.4 MG SL tablet Place 0.4 mg under the tongue every 5 (five) minutes x 3 doses as needed. For chest pain    . Tamsulosin HCl (FLOMAX) 0.4 MG CAPS Take 0.4 mg by mouth  every morning.     . valsartan-hydrochlorothiazide (DIOVAN-HCT) 160-25 MG per tablet 0.5 tablets daily.     . vitamin E (VITAMIN E) 400 UNIT capsule Take 400 Units by mouth every morning.     . vitamin k 100 MCG tablet Take 50 mcg by mouth once a week. Mondays    . mirabegron ER (MYRBETRIQ) 25 MG TB24 tablet Take 25 mg by mouth daily.     No current facility-administered medications on file prior to visit.    Allergies  Allergen Reactions  . Crestor [Rosuvastatin] Other (See Comments)    Myalgias  . Hygroton [Chlorthalidone] Other (See Comments)    Syncope  . Lipitor [Atorvastatin] Other (See Comments)    Dizziness  . Metamucil [Psyllium] Other (See Comments)    Syncope  . Mevacor [Lovastatin] Other (See Comments)    Muscle cramps  . Pravastatin Other (See Comments)    Myalgias  . Vytorin [Ezetimibe-Simvastatin] Other (See Comments)    Myalgias  . Zetia [Ezetimibe] Other (See Comments)    Myalgias  . Toviaz [Fesoterodine Fumarate Er]     REVIEW OF SYSTEMS:  (Positives checked otherwise negative)  CARDIOVASCULAR:  []  chest pain, []  chest pressure, []  palpitations, []  shortness of breath when laying flat, []  shortness of breath with exertion,  [x]  pain in feet when walking, []  pain in feet when laying flat, []  history of blood clot in veins (DVT), []  history of phlebitis, []  swelling in legs, []  varicose veins  PULMONARY:  []  productive cough, []  asthma, []  wheezing  NEUROLOGIC:  []  weakness in arms or legs, []  numbness in arms or legs, []  difficulty speaking or slurred speech, []  temporary loss of vision in one eye, []  dizziness  HEMATOLOGIC:  []  bleeding problems, []  problems with blood clotting too easily  MUSCULOSKEL:  []  joint pain, []  joint swelling  GASTROINTEST:  []  vomiting blood, []  blood in stool     GENITOURINARY:  []  burning with urination, []  blood in urine  PSYCHIATRIC:  []  history of major depression  INTEGUMENTARY:  []  rashes, []   ulcers  CONSTITUTIONAL:  []  fever, []  chills  For VQI Use Only   PRE-ADM LIVING: Home  AMB STATUS: Ambulatory  CAD Sx: None  PRIOR CHF: None  STRESS TEST: [x]  No, [ ]  Normal, [ ]  +  ischemia, [ ]  + MI, [ ]  Both   Physical Examination  Filed Vitals:   10/20/14 0945 10/20/14 0951  BP: 184/105 182/86  Pulse: 65 68  Resp: 16   Height: 5' 4.5" (1.638 m)   Weight: 150 lb (68.04 kg)   SpO2: 99%    Body mass index is 25.36 kg/(m^2).  General: A&O x 3, WDWN  Head: [place]/AT  Ear/Nose/Throat: Hearing grossly intact, nares w/o erythema or drainage, oropharynx w/o Erythema/Exudate  Eyes: PERRLA, EOMI, Post surg chg to pupils  Neck: Supple, no nuchal rigidity, no palpable LAD  Pulmonary: Sym exp, good air movt, CTAB, no rales, rhonchi, & wheezing  Cardiac: RRR, Nl S1, S2, no Murmurs, rubs or gallops  Vascular: Vessel Right Left  Radial Palpable Palpable  Brachial Palpable Palpable  Carotid Palpable, without bruit Palpable, without bruit  Aorta No palpable AAA N/A  Femoral Palpable Palpable  Popliteal Not palpable Not palpable  PT Palpable Palpable  DP Palpable Palpable   Gastrointestinal: soft, NTND, -G/R, - HSM, - masses, - CVAT B  Musculoskeletal: M/S 5/5 throughout , Extremities without ischemic changes   Neurologic: CN 2-12 intact , Pain and light touch intact in extremities , Motor exam as listed above  Psychiatric: Judgment intact, Mood & affect appropriate for pt's clinical situation  Dermatologic: See M/S exam for extremity exam, no rashes otherwise noted  Lymph : No Cervical, Axillary, or Inguinal lymphadenopathy    Non-Invasive Vascular Imaging  AAA Duplex (Date: 10/20/2014 )  Current size:  5.6 cm suprarenal (Date: 10/20/2014 )  Outside Studies/Documentation 4 pages of outside documents were reviewed including: outpt PCP chart and lifeline screening.  Medical Decision Making  The patient is a 81 y.o. male who presents with: large AAA, h/o of  CAD s/p CABG, advanced age   Based on this patient's exam and diagnostic studies, he needs CTA chest/abd/pelvis.  AAA Duplex already suggest suprarenal aneurysm, but the exact configuration, i.e. TAA vs perirenal is not possible from duplex ultrasound.  I doubt this patient is a candidate for TAA repair.  He might be a candidate for fenestrated repair if only mesenteric segment is aneurysmal.  Will try to get studies today or Monday at the latest for the patient's piece of mind.  I emphasized the importance of maximal medical management including strict control of blood pressure, blood glucose, and lipid levels, antiplatelet agents, obtaining regular exercise, and cessation of smoking.    Thank you for allowing [place] to participate in this patient's care.  Adele Barthel, MD [facility] Office: [phone] Pager: [phone]  10/20/2014, 10:21 AM     Addendum  CTA chest/abd/pelvis (10/20/2014) 1. No acute thoracic disease post CABG. 2. 5 mm right lower lobe pulmonary nodule. In the absence of any prior studies demonstrating stability, If the patient is at high risk for bronchogenic carcinoma, follow-up chest CT at 6-12 months is recommended. If the patient is at low risk for bronchogenic carcinoma, follow-up chest CT at 12 months is recommended. This recommendation follows the consensus statement: Guidelines for Management of Small Pulmonary Nodules Detected on CT Scans: A Statement from the [employer] as published in Radiology 2005;237:395-400. 3. 5.9 cm fusiform infrarenal abdominal aortic aneurysm. 4. Nonocclusive short segment dissection flap in the ectatic right common iliac artery. 5. Penetrating atheromatous ulcer versus partially thrombosed dissection, mid left common iliac artery.  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  There also appears to possible  short segment dissection in right common iliac artery.  Access  vessels appear large enough for endovascular repair.  He would probably need a FEVAR repair if patient considering repair.  Will have my partner Dr. Trula Slade review to see if he feels a FEVAR would be possible with a Cook device.  - Fortunately, the AAA duplex was incorrect and this patient does NOT have a suprarenal aneurysm.   - Unfortunately, the patient has a juxtarenal AAA which cannot be repaired with the standard on-self infrarenal EVAR. - At 81 years old with multiple co-morbidities, I think he would be a high risk for open repair. - Will have my partner, Dr. Trula Slade, review the CTA to see if he think a fenestrated EVAR might be possible. - I have left a message on the patient's answering machine to call me for the results.  Will need to see which way he is leaning: repair or expectant mgmt.  Adele Barthel, MD [facility] Office: [phone] Pager: [phone]  10/20/2014, 3:35 PM

## 2014-10-20 NOTE — Addendum Note (Signed)
Addended by: Mena Goes on: 10/20/2014 12:13 PM   Modules accepted: Orders

## 2014-10-23 DIAGNOSIS — M6281 Muscle weakness (generalized): Secondary | ICD-10-CM | POA: Diagnosis not present

## 2014-10-23 DIAGNOSIS — T466X1A Poisoning by antihyperlipidemic and antiarteriosclerotic drugs, accidental (unintentional), initial encounter: Secondary | ICD-10-CM | POA: Diagnosis not present

## 2014-10-23 DIAGNOSIS — G720 Drug-induced myopathy: Secondary | ICD-10-CM | POA: Diagnosis not present

## 2014-10-24 ENCOUNTER — Encounter: Payer: Self-pay | Admitting: Neurology

## 2014-10-24 ENCOUNTER — Ambulatory Visit (INDEPENDENT_AMBULATORY_CARE_PROVIDER_SITE_OTHER): Payer: Self-pay | Admitting: Neurology

## 2014-10-24 ENCOUNTER — Ambulatory Visit (INDEPENDENT_AMBULATORY_CARE_PROVIDER_SITE_OTHER): Payer: Medicare Other | Admitting: Neurology

## 2014-10-24 DIAGNOSIS — R269 Unspecified abnormalities of gait and mobility: Secondary | ICD-10-CM

## 2014-10-24 DIAGNOSIS — N33 Bladder disorders in diseases classified elsewhere: Secondary | ICD-10-CM | POA: Diagnosis not present

## 2014-10-24 DIAGNOSIS — G609 Hereditary and idiopathic neuropathy, unspecified: Secondary | ICD-10-CM

## 2014-10-24 DIAGNOSIS — M25569 Pain in unspecified knee: Secondary | ICD-10-CM

## 2014-10-24 DIAGNOSIS — I10 Essential (primary) hypertension: Secondary | ICD-10-CM | POA: Diagnosis not present

## 2014-10-24 DIAGNOSIS — I714 Abdominal aortic aneurysm, without rupture: Secondary | ICD-10-CM | POA: Diagnosis not present

## 2014-10-24 DIAGNOSIS — R29898 Other symptoms and signs involving the musculoskeletal system: Secondary | ICD-10-CM

## 2014-10-24 DIAGNOSIS — Z0289 Encounter for other administrative examinations: Secondary | ICD-10-CM

## 2014-10-24 NOTE — Progress Notes (Signed)
Please refer to EMG and nerve conduction study procedure note. 

## 2014-10-24 NOTE — Procedures (Signed)
     HISTORY:  Edward Cannon is an 81 year old gentleman with a history of pain in both legs, below the knees, with walking. The patient will note resolution of pain with rest, onset after 50 feet of walking. The patient has some sensation of weakness and fatigue of the legs. He indicated that this problem began after he was started on a statin drug. The patient is being evaluated for a possible myopathy.  NERVE CONDUCTION STUDIES:  Nerve conduction studies were performed on the left upper extremity. The left median nerve was studied, this reveals a normal distal motor latency and motor amplitude. The F wave latency and nerve conduction velocity was normal for this nerve. The left median sensory latency was normal.  Nerve conduction studies were performed on both lower extremities. The distal motor latencies and motor amplitudes for the peroneal and posterior tibial nerves were normal bilaterally. The nerve conduction velocities for these nerves were normal bilaterally with exception of some slowing above the knee only for the right peroneal nerve. The F wave latencies for the peroneal nerves bilaterally and for the right posterior tibial nerves were normal, slightly prolonged for the left posterior tibial nerve. The sensory latencies for the left sural nerve and for the peroneal nerves bilaterally were absent.  EMG STUDIES:  EMG study was performed on the right lower extremity:  The tibialis anterior muscle reveals 2 to 4K motor units with full recruitment. No fibrillations or positive waves were seen. The peroneus tertius muscle reveals 2 to 4K motor units with full recruitment. No fibrillations or positive waves were seen. The medial gastrocnemius muscle reveals 1 to 3K motor units with full recruitment. No fibrillations or positive waves were seen. The vastus lateralis muscle reveals 2 to 3K motor units with full recruitment. No fibrillations or positive waves were seen. The iliopsoas muscle  reveals 2 to 4K motor units with full recruitment. No fibrillations or positive waves were seen. The biceps femoris muscle (long head) reveals 2 to 4K motor units with full recruitment. No fibrillations or positive waves were seen. The lumbosacral paraspinal muscles were tested at 3 levels, and revealed no abnormalities of insertional activity at all 3 levels tested, with exception that one run of positive waves were seen at the lower level. There was good relaxation.   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 EMG evaluation of the right lower extremity was relatively unremarkable, without evidence of a myopathic disorder, or evidence of an overlying lumbosacral radiculopathy.  Jill Alexanders MD 10/24/2014 1:55 PM  [facility] [address] [place], [place] [address]  Phone [phone] Fax [phone]

## 2014-10-26 ENCOUNTER — Encounter: Payer: Self-pay | Admitting: Vascular Surgery

## 2014-10-26 DIAGNOSIS — T466X1A Poisoning by antihyperlipidemic and antiarteriosclerotic drugs, accidental (unintentional), initial encounter: Secondary | ICD-10-CM | POA: Diagnosis not present

## 2014-10-26 DIAGNOSIS — G720 Drug-induced myopathy: Secondary | ICD-10-CM | POA: Diagnosis not present

## 2014-10-26 DIAGNOSIS — M6281 Muscle weakness (generalized): Secondary | ICD-10-CM | POA: Diagnosis not present

## 2014-10-27 ENCOUNTER — Ambulatory Visit (HOSPITAL_COMMUNITY)
Admission: RE | Admit: 2014-10-27 | Discharge: 2014-10-27 | Disposition: A | Discharge status: Home / Self Care | Payer: Medicare Other | Source: Ambulatory Visit | Attending: Surgery | Admitting: Surgery

## 2014-10-27 ENCOUNTER — Ambulatory Visit (INDEPENDENT_AMBULATORY_CARE_PROVIDER_SITE_OTHER): Payer: Medicare Other | Admitting: Surgery

## 2014-10-27 ENCOUNTER — Encounter: Payer: Self-pay | Admitting: Vascular Surgery

## 2014-10-27 ENCOUNTER — Other Ambulatory Visit: Payer: Self-pay

## 2014-10-27 VITALS — BP 147/89 | HR 70 | Resp 18 | Ht 64.0 in | Wt 157.2 lb

## 2014-10-27 DIAGNOSIS — I714 Abdominal aortic aneurysm, without rupture, unspecified: Secondary | ICD-10-CM

## 2014-10-27 DIAGNOSIS — I25810 Atherosclerosis of coronary artery bypass graft(s) without angina pectoris: Secondary | ICD-10-CM

## 2014-10-27 DIAGNOSIS — Z01810 Encounter for preprocedural cardiovascular examination: Secondary | ICD-10-CM

## 2014-10-27 NOTE — Progress Notes (Signed)
Filed Vitals:   10/27/14 1254 10/27/14 1257  BP: 164/84 147/89  Pulse: 104 70  Resp: 18   Height: 5\' 4"  (1.626 m)   Weight: 157 lb 3.2 oz (71.305 kg)   Body mass index is 26.97 kg/(m^2).     Patient name: Edward Cannon MRN: [id number] DOB: [birth year] Sex: male     Chief Complaint  Patient presents with  . Follow-up    AAA duplex performed at [facility]   . AAA    HISTORY OF PRESENT ILLNESS: The patient came back today to have further discussions regarding his abdominal aortic aneurysm.  He has been followed by Dr. Bridgett Larsson.  He recently underwent a CT angiogram which showed that the aneurysm measured 5.6 cm in maximum aortic diameter.  It is juxtarenal.  He is asymptomatic.  The patient suffers hyperlipidemia.  He has a statin allergy.  He is on an ARB for hypertension.  He is a nonsmoker.  He has a pacemaker for sick sinus syndrome.  He is status post CABG.   Past Medical History  Diagnosis Date  . Hypertension   . Hyperlipidemia   . Microscopic hematuria   . Coronary artery disease S/P CABG   . Atrial fibrillation   . Bouchard nodes (DJD hand)   . Hx of colonic polyps   . Ventral hernia     Asymptomatic  . Diverticulosis     Wtih Anatomotic friability on colonscopy  . Urinary urgency     With Incontinence  . BPH (benign prostatic hypertrophy)   . 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 Syncope     Recurrent, second to Bradycardia  . Sinus node dysfunction   . Anginal pain 2005  . Pacemaker   . History of bronchitis winter 2012  . H/O scarlet fever 1936  . Leg weakness, bilateral 10/09/2014  . Abnormality of gait 10/09/2014  . AAA (abdominal aortic aneurysm) 10/04/14    Per  Dr. Viviana Simpler    Past Surgical History  Procedure Laterality Date  . [place]      Obtuse marginal branch of the left circumflex   . Rima      to the LAD  . Svg  2005    to posterior lateral branch of the RCA  . Low anterior bowel resection       for large villous  . Colon resection  ~ 2005  . Coronary artery bypass graft  ~ 2009    CABG X3  . Mastoidectomy  1930  . Cardiac catheterization    . Colon surgery    . Insert / replace / remove pacemaker  12/22/11  . Permanent pacemaker insertion N/A 12/22/2011    Procedure: PERMANENT PACEMAKER INSERTION;  Surgeon: Deboraha Sprang, MD;  Location[facility] CATH LAB;  Service: Cardiovascular;  Laterality: N/A;  . Mastoid surgery    . Cataract extraction Bilateral   . Eye surgery Bilateral 2012    History   Social History  . Marital Status: Married    Spouse Name: N/A  . Number of Children: 1  . Years of Education: PhD   Occupational History  . retired    Social History Main Topics  . Smoking status: Never Smoker   . Smokeless tobacco: Never Used  . Alcohol Use: 2.4 oz/week    4 Glasses of wine per week     Comment: 12/22/11 "I drink 2 oz red wine qd at the suggestion of my  cardiologist"  . Drug Use: No  . Sexual Activity: No   Other Topics Concern  . Not on file   Social History Narrative   Patient is right handed.   Patient does not drink caffeine.    Family History  Problem Relation Age of Onset  . Diabetes Mother   . Heart disease Mother     Pacemaker  . CAD Mother   . COPD Father   . Prostate cancer Father   . Pancreatic cancer Brother   . Other Brother     CABG  . Heart failure Brother   . Coronary artery disease Brother     Allergies as of 10/27/2014 - Review Complete 10/27/2014  Allergen Reaction Noted  . Crestor [rosuvastatin] Other (See Comments) 12/11/2011  . Hygroton [chlorthalidone] Other (See Comments) 12/11/2011  . Lipitor [atorvastatin] Other (See Comments) 12/11/2011  . Metamucil [psyllium] Other (See Comments) 12/11/2011  . Mevacor [lovastatin] Other (See Comments) 12/11/2011  . Pravastatin Other (See Comments) 12/11/2011  . Vytorin [ezetimibe-simvastatin] Other (See Comments) 12/11/2011  . Zetia [ezetimibe] Other (See Comments) 12/11/2011  .  Toviaz [fesoterodine fumarate er]  10/06/2014    Current Outpatient Prescriptions on File Prior to Visit  Medication Sig Dispense Refill  . amLODipine (NORVASC) 2.5 MG tablet     . AMLODIPINE BESYLATE PO Take 2.5 mg by mouth.    . Aspirin 81 MG EC tablet Take 81 mg by mouth every evening.     . cholecalciferol (VITAMIN D) 1000 UNITS tablet Take 1,000 Units by mouth every other day.    . cholestyramine (QUESTRAN) 4 G packet Take 1 packet by mouth 2 (two) times daily. Take in 4 oz orange juice followed by 8 oz water    . clotrimazole-betamethasone (LOTRISONE) cream Apply 1 application topically 2 (two) times daily.    . Multiple Vitamin (MULTIVITAMIN) tablet Take 1 tablet by mouth every other day. Without iron    . nitroGLYCERIN (NITROSTAT) 0.4 MG SL tablet Place 0.4 mg under the tongue every 5 (five) minutes x 3 doses as needed. For chest pain    . Tamsulosin HCl (FLOMAX) 0.4 MG CAPS Take 0.4 mg by mouth every morning.     . valsartan-hydrochlorothiazide (DIOVAN-HCT) 160-25 MG per tablet 0.5 tablets daily.     . vitamin E (VITAMIN E) 400 UNIT capsule Take 400 Units by mouth every morning.     . vitamin k 100 MCG tablet Take 50 mcg by mouth once a week. Mondays    . mirabegron ER (MYRBETRIQ) 25 MG TB24 tablet Take 25 mg by mouth daily.     No current facility-administered medications on file prior to visit.     REVIEW OF SYSTEMS: Cardiovascular: No chest pain, chest pressure, palpitations, orthopnea, or dyspnea on exertion. No claudication or rest pain,  No history of DVT or phlebitis. Pulmonary: No productive cough, asthma or wheezing. Neurologic: No weakness, paresthesias, aphasia, or amaurosis. No dizziness. Hematologic: No bleeding problems or clotting disorders. Musculoskeletal: Walked with a cane. Gastrointestinal: No blood in stool or hematemesis Genitourinary: No dysuria or hematuria. Psychiatric:: No history of major depression. Integumentary: No rashes or  ulcers. Constitutional: No fever or chills.  PHYSICAL EXAMINATION:   Vital signs are  Filed Vitals:   10/27/14 1254 10/27/14 1257  BP: 164/84 147/89  Pulse: 104 70  Resp: 18   Height: 5\' 4"  (1.626 m)   Weight: 157 lb 3.2 oz (71.305 kg)    Body mass index is 26.97 kg/(m^2). General: The patient  appears their stated age. HEENT:  No gross abnormalities Pulmonary:  Non labored breathing Abdomen: Soft and non-tender Musculoskeletal: There are no major deformities. Neurologic: No focal weakness or paresthesias are detected, Skin: There are no ulcer or rashes noted. Psychiatric: The patient has normal affect. Cardiovascular: There is a regular rate and rhythm without significant murmur appreciated.   Diagnostic Studies Carotid Dopplers: No significant carotid artery stenosis Lower extremity Dopplers: No popliteal aneurysm  Assessment: Juxtarenal abdominal aortic aneurysm Plan: After reviewing the images, I think he is a candidate for a fenestrated repair which can be done percutaneously.  I detailed the procedure with the patient, explaining the construction of the stent.  The final construction of the stent cannot be determined as I have not been able to fully reviewed the images on Tera Recon.  I discussed the risks and benefits of the operation which include the risk of renal insufficiency versus renal failure, cardiac complications, intestinal ischemia, paralysis, lower extremity complications and death.  He wants to get this repaired.  I told him that it would be these 4 weeks before I could get his graft made and delivered to the [place]  states.  I will contact him.  I will make sure that Dr. Tamala Julian is in agreement with proceeding from a cardiac standpoint.  Eldridge Abrahams, M.D. [facility] Office: [phone] Pager:  [phone]

## 2014-10-30 ENCOUNTER — Telehealth: Payer: Self-pay | Admitting: Interventional Cardiology

## 2014-10-30 DIAGNOSIS — I25810 Atherosclerosis of coronary artery bypass graft(s) without angina pectoris: Secondary | ICD-10-CM

## 2014-10-30 DIAGNOSIS — Z01818 Encounter for other preprocedural examination: Secondary | ICD-10-CM

## 2014-10-30 NOTE — Telephone Encounter (Signed)
New message       Request for surgical clearance:  1. What type of surgery is being performed? EVAR   When is this surgery scheduled?  Pending clearance 2. Are there any medications that need to be held prior to surgery and how long?  No medications just medical clearance  Name of physician performing surgery?  Dr Trula Slade 3. What is your office phone and fax number? Fax [phone]

## 2014-10-30 NOTE — Telephone Encounter (Signed)
Cardiac clearance request routed to Dr.Smith to advise.

## 2014-10-31 NOTE — Telephone Encounter (Signed)
Please schedule Lexiscan Nuclear for clearance for EVAR.

## 2014-10-31 NOTE — Telephone Encounter (Signed)
Pt and Bonnie @Dr .Brabham's office aware. Per Dr.Smith pt will need Lexiscan prior to cardiac clearance. Pt given verbal pre-test instructions. Adv pt a scheduler from our office will call him to schedule. Pt verbalized understanding.

## 2014-11-02 ENCOUNTER — Telehealth (HOSPITAL_COMMUNITY): Payer: Self-pay | Admitting: *Deleted

## 2014-11-02 NOTE — Telephone Encounter (Signed)
Patient given detailed instructions per Myocardial Perfusion Study Information Sheet for test on 11/06/14 at 9:45. Patient verbalized understanding. Crissie Figures, RN

## 2014-11-03 ENCOUNTER — Encounter: Payer: Self-pay | Admitting: Cardiology

## 2014-11-06 ENCOUNTER — Ambulatory Visit (HOSPITAL_COMMUNITY): Payer: Medicare Other | Attending: Cardiovascular Disease

## 2014-11-06 DIAGNOSIS — Z01818 Encounter for other preprocedural examination: Secondary | ICD-10-CM | POA: Diagnosis not present

## 2014-11-06 DIAGNOSIS — I25810 Atherosclerosis of coronary artery bypass graft(s) without angina pectoris: Secondary | ICD-10-CM | POA: Diagnosis not present

## 2014-11-06 DIAGNOSIS — I2510 Atherosclerotic heart disease of native coronary artery without angina pectoris: Secondary | ICD-10-CM | POA: Diagnosis not present

## 2014-11-06 LAB — MYOCARDIAL PERFUSION IMAGING
CHL CUP NUCLEAR SDS: 2
CHL CUP RESTING HR STRESS: 66 {beats}/min
CHL CUP STRESS STAGE 1 HR: 66 {beats}/min
CHL CUP STRESS STAGE 2 HR: 66 {beats}/min
CHL CUP STRESS STAGE 2 SPEED: 0 mph
CHL CUP STRESS STAGE 3 HR: 69 {beats}/min
CHL CUP STRESS STAGE 3 SPEED: 0 mph
CHL CUP STRESS STAGE 5 HR: 72 {beats}/min
CHL CUP STRESS STAGE 5 SBP: 142 mmHg
CHL CUP STRESS STAGE 5 SPEED: 0 mph
CSEPPMHR: 52 %
LV dias vol: 71 mL
LV sys vol: 29 mL
Nuc Stress EF: 59 %
Peak HR: 71 {beats}/min
RATE: 0.26
SRS: 3
SSS: 5
Stage 1 DBP: 110 mmHg
Stage 1 Grade: 0 %
Stage 1 SBP: 180 mmHg
Stage 1 Speed: 0 mph
Stage 2 Grade: 0 %
Stage 3 Grade: 0 %
Stage 4 Grade: 0 %
Stage 4 HR: 71 {beats}/min
Stage 4 Speed: 0 mph
Stage 5 DBP: 71 mmHg
Stage 5 Grade: 0 %
Stage 6 Grade: 0 %
Stage 6 HR: 69 {beats}/min
Stage 6 Speed: 0 mph
TID: 1.02

## 2014-11-06 MED ORDER — TECHNETIUM TC 99M SESTAMIBI GENERIC - CARDIOLITE
10.0000 | Freq: Once | INTRAVENOUS | Status: AC | PRN
  Administered 2014-11-06: 10 via INTRAVENOUS

## 2014-11-06 MED ORDER — REGADENOSON 0.4 MG/5ML IV SOLN
0.4000 mg | Freq: Once | INTRAVENOUS | Status: AC
  Administered 2014-11-06: 0.4 mg via INTRAVENOUS

## 2014-11-06 MED ORDER — TECHNETIUM TC 99M SESTAMIBI GENERIC - CARDIOLITE
33.0000 | Freq: Once | INTRAVENOUS | Status: AC | PRN
  Administered 2014-11-06: 33 via INTRAVENOUS

## 2014-11-07 ENCOUNTER — Telehealth: Payer: Self-pay

## 2014-11-07 NOTE — Telephone Encounter (Signed)
-----   Message from Belva Crome, MD sent at 11/06/2014  6:59 PM EDT ----- Let patient and Dr. Trula Slade know that the nuclear is low risk and patient is cleared to proceed with the planned endovascular procedure

## 2014-11-07 NOTE — Telephone Encounter (Signed)
Called to give pt myoview results, and adv him that Dr.Smith has cleared him for sx with Dr.Brabham. Unable to lmom. Pt phone line busy. Message routed to Dr.Brabham

## 2014-11-09 NOTE — Telephone Encounter (Signed)
-----   Message from Belva Crome, MD sent at 11/06/2014  6:59 PM EDT ----- Let patient and Dr. Trula Slade know that the nuclear is low risk and patient is cleared to proceed with the planned endovascular procedure

## 2014-11-09 NOTE — Telephone Encounter (Signed)
Pt aware of myoview results

## 2014-11-10 ENCOUNTER — Telehealth: Payer: Self-pay | Admitting: *Deleted

## 2014-11-10 NOTE — Telephone Encounter (Signed)
Patient called to report that Dr. Tamala Julian has cleared him for AAA stent graft surgery. He has a lot of questions regarding this surgery and would like to make another appt with Dr. Trula Slade to discuss his concerns. Michelle at our front desk will call him to schedule. Patient would like to have surgery scheduled after 12-11-14 if possible.

## 2014-11-28 ENCOUNTER — Encounter: Payer: Self-pay | Admitting: Surgery

## 2014-12-01 ENCOUNTER — Encounter: Payer: Self-pay | Admitting: Surgery

## 2014-12-01 ENCOUNTER — Ambulatory Visit (INDEPENDENT_AMBULATORY_CARE_PROVIDER_SITE_OTHER): Payer: Medicare Other | Admitting: Surgery

## 2014-12-01 VITALS — BP 142/70 | HR 69 | Resp 14 | Ht 65.0 in | Wt 156.0 lb

## 2014-12-01 DIAGNOSIS — I714 Abdominal aortic aneurysm, without rupture, unspecified: Secondary | ICD-10-CM

## 2014-12-01 DIAGNOSIS — I25810 Atherosclerosis of coronary artery bypass graft(s) without angina pectoris: Secondary | ICD-10-CM | POA: Diagnosis not present

## 2014-12-01 NOTE — Progress Notes (Signed)
Patient name: Edward Cannon MRN: [id number] DOB: [birth year] Sex: male     Chief Complaint  Patient presents with  . 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  He denies any abdominal pain or back pain.  The patient suffers hyperlipidemia. He has a statin allergy. He is on an ARB for hypertension. He is a nonsmoker. He has a pacemaker for sick sinus syndrome. He is status post CABG  Past Medical History  Diagnosis Date  . Hypertension   . Hyperlipidemia   . Microscopic hematuria   . Coronary artery disease S/P CABG   . Atrial fibrillation   . Bouchard nodes (DJD hand)   . Hx of colonic polyps   . Ventral hernia     Asymptomatic  . Diverticulosis     Wtih Anatomotic friability on colonscopy  . Urinary urgency     With Incontinence  . BPH (benign prostatic hypertrophy)   . 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 Syncope     Recurrent, second to Bradycardia  . Sinus node dysfunction   . Anginal pain 2005  . Pacemaker   . History of bronchitis winter 2012  . H/O scarlet fever 1936  . Leg weakness, bilateral 10/09/2014  . Abnormality of gait 10/09/2014  . AAA (abdominal aortic aneurysm) 10/04/14    Per  Dr. Viviana Simpler    Past Surgical History  Procedure Laterality Date  . [place]      Obtuse marginal branch of the left circumflex   . Rima      to the LAD  . Svg  2005    to posterior lateral branch of the RCA  . Low anterior bowel resection      for large villous  . Colon resection  ~ 2005  . Coronary artery bypass graft  ~ 2009    CABG X3  . Mastoidectomy  1930  . Cardiac catheterization    . Colon surgery    . Insert / replace / remove pacemaker  12/22/11  . Permanent pacemaker insertion N/A 12/22/2011    Procedure: PERMANENT PACEMAKER INSERTION;  Surgeon: Deboraha Sprang, MD;   Location: [facility] CATH LAB;  Service: Cardiovascular;  Laterality: N/A;  . Mastoid surgery    . Cataract extraction Bilateral   . Eye surgery Bilateral 2012    History   Social History  . Marital Status: Married    Spouse Name: N/A  . Number of Children: 1  . Years of Education: PhD   Occupational History  . retired    Social History Main Topics  . Smoking status: Never Smoker   . Smokeless tobacco: Never Used  . Alcohol Use: 2.4 oz/week    4 Glasses of wine per week     Comment: 12/22/11 "I drink 2 oz red wine qd at the suggestion of my cardiologist"  . Drug Use: No  . Sexual Activity: No   Other Topics Concern  . Not on file   Social History Narrative   Patient is right handed.   Patient does not drink caffeine.    Family History  Problem Relation Age of Onset  . Diabetes Mother   . Heart disease Mother     Pacemaker  . CAD Mother   . COPD Father   . Prostate cancer  Father   . Pancreatic cancer Brother   . Other Brother     CABG  . Heart failure Brother   . Coronary artery disease Brother     Allergies as of 12/01/2014 - Review Complete 12/01/2014  Allergen Reaction Noted  . Crestor [rosuvastatin] Other (See Comments) 12/11/2011  . Hygroton [chlorthalidone] Other (See Comments) 12/11/2011  . Lipitor [atorvastatin] Other (See Comments) 12/11/2011  . Metamucil [psyllium] Other (See Comments) 12/11/2011  . Mevacor [lovastatin] Other (See Comments) 12/11/2011  . Pravastatin Other (See Comments) 12/11/2011  . Vytorin [ezetimibe-simvastatin] Other (See Comments) 12/11/2011  . Zetia [ezetimibe] Other (See Comments) 12/11/2011  . Toviaz [fesoterodine fumarate er]  10/06/2014    Current Outpatient Prescriptions on File Prior to Visit  Medication Sig Dispense Refill  . amLODipine (NORVASC) 2.5 MG tablet     . AMLODIPINE BESYLATE PO Take 2.5 mg by mouth.    . Aspirin 81 MG EC tablet Take 81 mg by mouth every evening.     . cholecalciferol (VITAMIN D) 1000 UNITS  tablet Take 1,000 Units by mouth every other day.    . cholestyramine (QUESTRAN) 4 G packet Take 1 packet by mouth 2 (two) times daily. Take in 4 oz orange juice followed by 8 oz water    . clotrimazole-betamethasone (LOTRISONE) cream Apply 1 application topically 2 (two) times daily.    . Multiple Vitamin (MULTIVITAMIN) tablet Take 1 tablet by mouth every other day. Without iron    . nitroGLYCERIN (NITROSTAT) 0.4 MG SL tablet Place 0.4 mg under the tongue every 5 (five) minutes x 3 doses as needed. For chest pain    . Tamsulosin HCl (FLOMAX) 0.4 MG CAPS Take 0.4 mg by mouth every morning.     . valsartan-hydrochlorothiazide (DIOVAN-HCT) 160-25 MG per tablet 0.5 tablets daily.     . vitamin E (VITAMIN E) 400 UNIT capsule Take 400 Units by mouth every morning.     . vitamin k 100 MCG tablet Take 50 mcg by mouth once a week. Mondays    . mirabegron ER (MYRBETRIQ) 25 MG TB24 tablet Take 25 mg by mouth daily.     No current facility-administered medications on file prior to visit.     REVIEW OF SYSTEMS: No changes from most recent visit  PHYSICAL EXAMINATION:   Vital signs are  Filed Vitals:   12/01/14 0846 12/01/14 0848  BP: 160/76 142/70  Pulse: 85 69  Resp: 14   Height: 5\' 5"  (1.651 m)   Weight: 156 lb (70.761 kg)    Body mass index is 25.96 kg/(m^2). General: The patient appears their stated age. HEENT:  No gross abnormalities Pulmonary:  Non labored breathing Abdomen: Soft and non-tender Musculoskeletal: There are no major deformities. Neurologic: No focal weakness or paresthesias are detected, Skin: There are no ulcer or rashes noted. Psychiatric: The patient has normal affect. Cardiovascular: There is a regular rate and rhythm without significant murmur appreciated.   Diagnostic Studies Carotid Doppler studies: 2047-07-24 percent bilateral stenosis. ABIs: Triphasic waveforms, 1.0 popliteal arteries are normal diameter  Assessment: Juxtarenal abdominal aortic  aneurysm Plan: I discussed proceeding with fenestrated endovascular repair of his juxtarenal abdominal aortic aneurysm.  We discussed the risks and benefits including the risk of intestinal complications, renal complications and lower extremity complications.  After a well-informed discussion including multiple drawings, he is willing to proceed.  I'm going to order his device with plans for repair with the device comes in likely in 6 weeks.  Franciso Bend Beaumont Austad IV,  M.D. [facility] Office: [phone] Pager:  [phone]

## 2014-12-01 NOTE — Progress Notes (Signed)
Filed Vitals:   12/01/14 0846 12/01/14 0848  BP: 160/76 142/70  Pulse: 85 69  Resp: 14   Height: 5\' 5"  (1.651 m)   Weight: 156 lb (70.761 kg)    Body mass index is 25.96 kg/(m^2).

## 2014-12-04 ENCOUNTER — Ambulatory Visit: Payer: Medicare Other | Admitting: Surgery

## 2014-12-11 ENCOUNTER — Ambulatory Visit: Payer: Medicare Other | Admitting: Surgery

## 2015-01-09 ENCOUNTER — Encounter: Payer: Self-pay | Admitting: *Deleted

## 2015-02-01 ENCOUNTER — Other Ambulatory Visit: Payer: Self-pay

## 2015-02-01 NOTE — Progress Notes (Addendum)
Anesthesia Chart Review: Patient is a 81 year old male scheduled for AAA endovascular fenestrated stent graft on 02/15/15 by Dr. Trula Slade. Case is posted from 9:45 AM - 6:09 PM.  PAT visit has not yet been scheduled, but I was asked by [facility] to have anesthesia view chart now to see if additional clearances are anticipated.  History includes 5.9 AAA, HTN, HLD, CAD s/p CABG '05 (Dr. Cyndia Bent), afib, SA node dysfunction with bradycardia/pause related syncope s/p St. Jude PPM 12/22/11, non-smoker, BPH, microscopic hematuria, myopathy (possible statin induced), colon resection '05, non-smoker. PCP is Dr. Viviana Simpler.  Cardiologist is Dr. Tamala Julian who cleared patient to proceed with planned endovascular procedure (see telephone encounter from [place], [facility] 11/07/14). EP cardiologist is Dr. Caryl Comes.   11/06/14 Nuclear stress test: I have been unable to locate the final interpretation. Report was signed by Dr. Ena Dawley.  11/06/14 EKG (from stress test, see Muse or Media tab): NSR, right BBB, LAD, possible lateral infarct (old), non-specific ST abnormality. EKG appears stable when compared to 01/04/14 tracing.    Last LHC was on 11/09/03 pre-CABG (under Procedures tab).  10/27/14 Carotid duplex: Bilateral 1 - 49% diameter reduction of the proximal ICAs.   10/20/14 CTA abd/pelvis: IMPRESSION: 1. No acute thoracic disease post CABG. 2. 5 mm right lower lobe pulmonary nodule. In the absence of any prior studies demonstrating stability, If the patient is at high risk for bronchogenic carcinoma, follow-up chest CT at 6-12 months is recommended. If the patient is at low risk for bronchogenic carcinoma, follow-up chest CT at 12 months is recommended. This recommendation follows the consensus statement: Guidelines for Management of Small Pulmonary Nodules Detected on CT Scans: A Statement from the [place] as published in Radiology 2005;237:395-400. 3. 5.9 cm fusiform infrarenal abdominal aortic  aneurysm. 4. Nonocclusive short segment dissection flap in the ectatic right common iliac artery. 5. Penetrating atheromatous ulcer versus partially thrombosed dissection, mid left common iliac artery.  PAT is scheduled for 02/09/15 at 9 AM. He will need labs and any additional pre-operative testing ordered at that time. In the interim, I will contact Dr. Smith's/Dr. Nelson's office regarding trying to get (or locate) the final report on his 10/2014 stress test. Reviewed with anesthesiologist Dr. Smith Robert. No new recommendations at this time.  George Hugh [facility] Short Stay Center/Anesthesiology Phone [phone] 02/01/2015 5:16 PM  Addendum: PAT was this morning. Labs noted. EKG showed a-paced rhythm, incomplete right BBB, non-specific ST abnormality. No significant change since last tracing.    I contacted [facility] on 02/02/15 regarding stress test results and was referred to medical records. I spoke with Maudie Mercury who could not find the final interpretation either. She was going to discuss with staff in their nuclear department and get back with me. I called again yesterday for follow-up but only got a voice message. This morning I called and spoke with one of [facility] triage nurses. She has sent a message to Dr. Tamala Julian for input. I also sent him a staff message to clarify that we are looking for the perfusion summary. Chart will be left for follow-up regarding additional cardiology input and completed perioperative cardiac device form.  George Hugh [facility] Short Stay Center/Anesthesiology Phone [phone] 02/09/2015 3:56 PM  Addendum: Dr. Meda Coffee created an addendum to patient's 11/06/14 stress test:    There is no resting or stress perfusion defect. This study is normal, there is no evidence of prior scar or ischemia. EF 59%.  Myra Gianotti, PA-C [facility]  Short Stay Center/Anesthesiology Phone [phone] 02/12/2015 9:25 AM

## 2015-02-02 DIAGNOSIS — R269 Unspecified abnormalities of gait and mobility: Secondary | ICD-10-CM | POA: Diagnosis not present

## 2015-02-02 DIAGNOSIS — E559 Vitamin D deficiency, unspecified: Secondary | ICD-10-CM | POA: Diagnosis not present

## 2015-02-02 DIAGNOSIS — N33 Bladder disorders in diseases classified elsewhere: Secondary | ICD-10-CM | POA: Diagnosis not present

## 2015-02-02 DIAGNOSIS — E782 Mixed hyperlipidemia: Secondary | ICD-10-CM | POA: Diagnosis not present

## 2015-02-02 DIAGNOSIS — I714 Abdominal aortic aneurysm, without rupture: Secondary | ICD-10-CM | POA: Diagnosis not present

## 2015-02-02 DIAGNOSIS — I10 Essential (primary) hypertension: Secondary | ICD-10-CM | POA: Diagnosis not present

## 2015-02-02 DIAGNOSIS — K5900 Constipation, unspecified: Secondary | ICD-10-CM | POA: Diagnosis not present

## 2015-02-08 NOTE — Pre-Procedure Instructions (Addendum)
SILVESTRO BLUMENSCHEIN  02/08/2015      [place], [place] [address] Phone: [phone] Fax: [phone]    Your procedure is scheduled on Thurs, Sept 1 @ 9:45 AM  Report to [facility] Admitting at 7:45 AM  Call this number if you have problems the morning of surgery:  [phone]   Remember:  Do not eat food or drink liquids after midnight.  Take these medicines the morning of surgery with A SIP OF WATER Amlodipine(Norvasc) and Tamsulosin(Flomax)              Stop taking your Vit E,Vitamins,and any Herbal Medications. No Goody's,BC's,Aleve,Ibuprofen,or Fish Oil. tomorrow   Do not wear jewelry.  Do not wear lotions, powders, or colognes.  You may wear deodorant.  Men may shave face and neck.  Do not bring valuables to the hospital.  [facility] is not responsible for any belongings or valuables.  Contacts, dentures or bridgework may not be worn into surgery.  Leave your suitcase in the car.  After surgery it may be brought to your room.  For patients admitted to the hospital, discharge time will be determined by your treatment team.  Patients discharged the day of surgery will not be allowed to drive home.    Special instructions:  [facility] - Preparing for Surgery  Before surgery, you can play an important role.  Because skin is not sterile, your skin needs to be as free of germs as possible.  You can reduce the number of germs on you skin by washing with CHG (chlorahexidine gluconate) soap before surgery.  CHG is an antiseptic cleaner which kills germs and bonds with the skin to continue killing germs even after washing.  Please DO NOT use if you have an allergy to CHG or antibacterial soaps.  If your skin becomes reddened/irritated stop using the CHG and inform your nurse when you arrive at Short Stay.  Do not shave (including legs and underarms) for at least 48 hours prior to the  first CHG shower.  You may shave your face.  Please follow these instructions carefully:   1.  Shower with CHG Soap the night before surgery and the                                morning of Surgery.  2.  If you choose to wash your hair, wash your hair first as usual with your       normal shampoo.  3.  After you shampoo, rinse your hair and body thoroughly to remove the                      Shampoo.  4.  Use CHG as you would any other liquid soap.  You can apply chg directly       to the skin and wash gently with scrungie or a clean washcloth.  5.  Apply the CHG Soap to your body ONLY FROM THE NECK DOWN.        Do not use on open wounds or open sores.  Avoid contact with your eyes,       ears, mouth and genitals (private parts).  Wash genitals (private parts)       with your normal soap.  6.  Wash thoroughly, paying  special attention to the area where your surgery        will be performed.  7.  Thoroughly rinse your body with warm water from the neck down.  8.  DO NOT shower/wash with your normal soap after using and rinsing off       the CHG Soap.  9.  Pat yourself dry with a clean towel.            10.  Wear clean pajamas.            11.  Place clean sheets on your bed the night of your first shower and do not        sleep with pets.  Day of Surgery  Do not apply any lotions/deoderants the morning of surgery.  Please wear clean clothes to the hospital/surgery center.    Please read over the following fact sheets that you were given. Pain Booklet, Coughing and Deep Breathing, Blood Transfusion Information, MRSA Information and Surgical Site Infection Prevention

## 2015-02-09 ENCOUNTER — Telehealth: Payer: Self-pay | Admitting: Interventional Cardiology

## 2015-02-09 ENCOUNTER — Encounter (HOSPITAL_COMMUNITY): Payer: Self-pay

## 2015-02-09 ENCOUNTER — Encounter (HOSPITAL_COMMUNITY)
Admission: RE | Admit: 2015-02-09 | Discharge: 2015-02-09 | Disposition: A | Discharge status: Home / Self Care | Payer: Medicare Other | Source: Ambulatory Visit | Attending: Surgery | Admitting: Surgery

## 2015-02-09 DIAGNOSIS — R9431 Abnormal electrocardiogram [ECG] [EKG]: Secondary | ICD-10-CM | POA: Insufficient documentation

## 2015-02-09 DIAGNOSIS — Z0183 Encounter for blood typing: Secondary | ICD-10-CM | POA: Insufficient documentation

## 2015-02-09 DIAGNOSIS — I714 Abdominal aortic aneurysm, without rupture: Secondary | ICD-10-CM | POA: Diagnosis not present

## 2015-02-09 DIAGNOSIS — Z01812 Encounter for preprocedural laboratory examination: Secondary | ICD-10-CM | POA: Diagnosis not present

## 2015-02-09 DIAGNOSIS — Z01810 Encounter for preprocedural cardiovascular examination: Secondary | ICD-10-CM | POA: Diagnosis not present

## 2015-02-09 LAB — URINALYSIS, ROUTINE W REFLEX MICROSCOPIC
BILIRUBIN URINE: NEGATIVE
Glucose, UA: NEGATIVE mg/dL
KETONES UR: NEGATIVE mg/dL
LEUKOCYTES UA: NEGATIVE
NITRITE: NEGATIVE
PROTEIN: NEGATIVE mg/dL
Specific Gravity, Urine: 1.012 (ref 1.005–1.030)
UROBILINOGEN UA: 0.2 mg/dL (ref 0.0–1.0)
pH: 6.5 (ref 5.0–8.0)

## 2015-02-09 LAB — COMPREHENSIVE METABOLIC PANEL
ALK PHOS: 58 U/L (ref 38–126)
ALT: 13 U/L — ABNORMAL LOW (ref 17–63)
ANION GAP: 8 (ref 5–15)
AST: 18 U/L (ref 15–41)
Albumin: 3.7 g/dL (ref 3.5–5.0)
BILIRUBIN TOTAL: 1.1 mg/dL (ref 0.3–1.2)
BUN: 14 mg/dL (ref 6–20)
CALCIUM: 8.7 mg/dL — AB (ref 8.9–10.3)
CO2: 23 mmol/L (ref 22–32)
Chloride: 102 mmol/L (ref 101–111)
Creatinine, Ser: 1.18 mg/dL (ref 0.61–1.24)
GFR calc non Af Amer: 54 mL/min — ABNORMAL LOW (ref >60)
Glucose, Bld: 105 mg/dL — ABNORMAL HIGH (ref 65–99)
POTASSIUM: 4.3 mmol/L (ref 3.5–5.1)
Sodium: 133 mmol/L — ABNORMAL LOW (ref 135–145)
TOTAL PROTEIN: 7 g/dL (ref 6.5–8.1)

## 2015-02-09 LAB — BLOOD GAS, ARTERIAL
Acid-Base Excess: 0.6 mmol/L (ref 0.0–2.0)
BICARBONATE: 24.4 meq/L — AB (ref 20.0–24.0)
Drawn by: 421801
FIO2: 0.21
O2 SAT: 97.8 %
PO2 ART: 94.2 mmHg (ref 80.0–100.0)
Patient temperature: 98.6
TCO2: 25.6 mmol/L (ref 0–100)
pCO2 arterial: 37.6 mmHg (ref 35.0–45.0)
pH, Arterial: 7.428 (ref 7.350–7.450)

## 2015-02-09 LAB — PROTIME-INR
INR: 1.04 (ref 0.00–1.49)
PROTHROMBIN TIME: 13.8 s (ref 11.6–15.2)

## 2015-02-09 LAB — TYPE AND SCREEN
ABO/RH(D): A POS
ANTIBODY SCREEN: NEGATIVE

## 2015-02-09 LAB — URINE MICROSCOPIC-ADD ON

## 2015-02-09 LAB — CBC
HEMATOCRIT: 36 % — AB (ref 39.0–52.0)
HEMOGLOBIN: 12.6 g/dL — AB (ref 13.0–17.0)
MCH: 33.2 pg (ref 26.0–34.0)
MCHC: 35 g/dL (ref 30.0–36.0)
MCV: 95 fL (ref 78.0–100.0)
Platelets: 121 10*3/uL — ABNORMAL LOW (ref 150–400)
RBC: 3.79 MIL/uL — ABNORMAL LOW (ref 4.22–5.81)
RDW: 12.4 % (ref 11.5–15.5)
WBC: 5 10*3/uL (ref 4.0–10.5)

## 2015-02-09 LAB — ABO/RH: ABO/RH(D): A POS

## 2015-02-09 LAB — SURGICAL PCR SCREEN
MRSA, PCR: NEGATIVE
STAPHYLOCOCCUS AUREUS: NEGATIVE

## 2015-02-09 LAB — APTT: aPTT: 26 seconds (ref 24–37)

## 2015-02-09 NOTE — Progress Notes (Signed)
Pacer order sheet faxed 02/08/15

## 2015-02-09 NOTE — Telephone Encounter (Signed)
Spoke with Ebony Hail at [facility] and she states that pt is due to have a AAA repair/graft next Thursday. Ebony Hail states that anesthesiologist said that they see Dr. Thompson Caul interpretation of the pt's Myoview from May but wanted more information regarding how it was decided that this study was a low risk. Advised Ebony Hail that I would route this information to Dr. Tamala Julian for review and advisement.   Phone number to [facility] is [phone]. Will route to Dr. Tamala Julian and his covering CMA, , Lattie Haw for review, advisement and follow up.

## 2015-02-09 NOTE — Telephone Encounter (Signed)
New message     Has a question regarding some test results Pt scheduled for surgery on Sept 1st

## 2015-02-10 NOTE — Telephone Encounter (Signed)
I see their concern. There is no official scan interpretation by Dr. Meda Coffee. We need to have her perform an addendum stating the official and final interpretation. My mention of low risk was based on my personal review of the images.  Please forward to Dr. Meda Coffee for fainal reading/addendum that we can then send to Anesthesia.

## 2015-02-14 MED ORDER — SODIUM CHLORIDE 0.9 % IV SOLN: INTRAVENOUS | Status: DC

## 2015-02-14 MED ORDER — CHLORHEXIDINE GLUCONATE CLOTH 2 % EX PADS: 6.0000 | MEDICATED_PAD | Freq: Once | CUTANEOUS | Status: DC

## 2015-02-14 MED ORDER — DEXTROSE 5 % IV SOLN
1.5000 g | INTRAVENOUS | Status: AC
  Administered 2015-02-15: 1.5 g via INTRAVENOUS
  Filled 2015-02-14: qty 1.5

## 2015-02-14 NOTE — Progress Notes (Signed)
Call to [facility], left voice message relative to pt's DOS & time, type of surgery & that the order fr. Cardiac indicates that his device needs to be managed for surgery.

## 2015-02-15 ENCOUNTER — Inpatient Hospital Stay (HOSPITAL_COMMUNITY)
Admission: RE | Admit: 2015-02-15 | Discharge: 2015-02-19 | DRG: 268 | Disposition: A | Discharge status: Home / Self Care | Payer: Medicare Other | Source: Ambulatory Visit | Attending: Surgery | Admitting: Surgery

## 2015-02-15 ENCOUNTER — Inpatient Hospital Stay (HOSPITAL_COMMUNITY): Payer: Medicare Other | Admitting: Vascular Surgery

## 2015-02-15 ENCOUNTER — Encounter (HOSPITAL_COMMUNITY): Payer: Self-pay | Admitting: *Deleted

## 2015-02-15 ENCOUNTER — Inpatient Hospital Stay (HOSPITAL_COMMUNITY): Payer: Medicare Other

## 2015-02-15 ENCOUNTER — Encounter (HOSPITAL_COMMUNITY)
Admission: RE | Disposition: A | Discharge status: Home / Self Care | Payer: Self-pay | Source: Ambulatory Visit | Attending: Surgery

## 2015-02-15 DIAGNOSIS — I714 Abdominal aortic aneurysm, without rupture, unspecified: Secondary | ICD-10-CM | POA: Diagnosis present

## 2015-02-15 DIAGNOSIS — I10 Essential (primary) hypertension: Secondary | ICD-10-CM | POA: Diagnosis not present

## 2015-02-15 DIAGNOSIS — I4891 Unspecified atrial fibrillation: Secondary | ICD-10-CM | POA: Diagnosis present

## 2015-02-15 DIAGNOSIS — Z79899 Other long term (current) drug therapy: Secondary | ICD-10-CM

## 2015-02-15 DIAGNOSIS — N179 Acute kidney failure, unspecified: Secondary | ICD-10-CM | POA: Diagnosis not present

## 2015-02-15 DIAGNOSIS — Z8679 Personal history of other diseases of the circulatory system: Secondary | ICD-10-CM

## 2015-02-15 DIAGNOSIS — Z951 Presence of aortocoronary bypass graft: Secondary | ICD-10-CM | POA: Diagnosis not present

## 2015-02-15 DIAGNOSIS — M542 Cervicalgia: Secondary | ICD-10-CM | POA: Diagnosis not present

## 2015-02-15 DIAGNOSIS — I701 Atherosclerosis of renal artery: Secondary | ICD-10-CM | POA: Diagnosis present

## 2015-02-15 DIAGNOSIS — D696 Thrombocytopenia, unspecified: Secondary | ICD-10-CM | POA: Diagnosis not present

## 2015-02-15 DIAGNOSIS — T508X5A Adverse effect of diagnostic agents, initial encounter: Secondary | ICD-10-CM | POA: Diagnosis not present

## 2015-02-15 DIAGNOSIS — Z9889 Other specified postprocedural states: Secondary | ICD-10-CM

## 2015-02-15 DIAGNOSIS — I2510 Atherosclerotic heart disease of native coronary artery without angina pectoris: Secondary | ICD-10-CM | POA: Diagnosis present

## 2015-02-15 DIAGNOSIS — Z95828 Presence of other vascular implants and grafts: Secondary | ICD-10-CM

## 2015-02-15 DIAGNOSIS — N400 Enlarged prostate without lower urinary tract symptoms: Secondary | ICD-10-CM | POA: Diagnosis present

## 2015-02-15 DIAGNOSIS — Z950 Presence of cardiac pacemaker: Secondary | ICD-10-CM | POA: Diagnosis not present

## 2015-02-15 DIAGNOSIS — E785 Hyperlipidemia, unspecified: Secondary | ICD-10-CM | POA: Diagnosis present

## 2015-02-15 DIAGNOSIS — Z7982 Long term (current) use of aspirin: Secondary | ICD-10-CM | POA: Diagnosis not present

## 2015-02-15 DIAGNOSIS — N170 Acute kidney failure with tubular necrosis: Secondary | ICD-10-CM | POA: Diagnosis not present

## 2015-02-15 HISTORY — PX: RENAL ARTERY STENT: SHX2321

## 2015-02-15 HISTORY — PX: ABDOMINAL AORTIC ENDOVASCULAR FENESTRATED STENT GRAFT: SHX6430

## 2015-02-15 LAB — BASIC METABOLIC PANEL
Anion gap: 6 (ref 5–15)
BUN: 17 mg/dL (ref 6–20)
CHLORIDE: 102 mmol/L (ref 101–111)
CO2: 25 mmol/L (ref 22–32)
CREATININE: 1.3 mg/dL — AB (ref 0.61–1.24)
Calcium: 7.9 mg/dL — ABNORMAL LOW (ref 8.9–10.3)
GFR calc Af Amer: 56 mL/min — ABNORMAL LOW (ref >60)
GFR calc non Af Amer: 48 mL/min — ABNORMAL LOW (ref >60)
GLUCOSE: 116 mg/dL — AB (ref 65–99)
Potassium: 3.8 mmol/L (ref 3.5–5.1)
Sodium: 133 mmol/L — ABNORMAL LOW (ref 135–145)

## 2015-02-15 LAB — CBC
HCT: 29.4 % — ABNORMAL LOW (ref 39.0–52.0)
HCT: 32.8 % — ABNORMAL LOW (ref 39.0–52.0)
HEMOGLOBIN: 10.2 g/dL — AB (ref 13.0–17.0)
Hemoglobin: 11.2 g/dL — ABNORMAL LOW (ref 13.0–17.0)
MCH: 33.2 pg (ref 26.0–34.0)
MCH: 32.7 pg (ref 26.0–34.0)
MCHC: 34.7 g/dL (ref 30.0–36.0)
MCHC: 34.1 g/dL (ref 30.0–36.0)
MCV: 95.8 fL (ref 78.0–100.0)
MCV: 95.9 fL (ref 78.0–100.0)
PLATELETS: 99 10*3/uL — AB (ref 150–400)
Platelets: 96 10*3/uL — ABNORMAL LOW (ref 150–400)
RBC: 3.07 MIL/uL — ABNORMAL LOW (ref 4.22–5.81)
RBC: 3.42 MIL/uL — ABNORMAL LOW (ref 4.22–5.81)
RDW: 12.6 % (ref 11.5–15.5)
RDW: 12.5 % (ref 11.5–15.5)
WBC: 6.6 10*3/uL (ref 4.0–10.5)
WBC: 7.8 10*3/uL (ref 4.0–10.5)

## 2015-02-15 LAB — PROTIME-INR
INR: 1.34 (ref 0.00–1.49)
PROTHROMBIN TIME: 16.7 s — AB (ref 11.6–15.2)

## 2015-02-15 LAB — MAGNESIUM: Magnesium: 1.8 mg/dL (ref 1.7–2.4)

## 2015-02-15 LAB — APTT: aPTT: 33 seconds (ref 24–37)

## 2015-02-15 SURGERY — ABDOMINAL AORTIC ENDOVASCULAR FENESTRATED STENT GRAFT
Anesthesia: General | Site: Abdomen

## 2015-02-15 MED ORDER — LACTATED RINGERS IV SOLN
INTRAVENOUS | Status: DC
  Administered 2015-02-15 (×3): via INTRAVENOUS

## 2015-02-15 MED ORDER — ROCURONIUM BROMIDE 100 MG/10ML IV SOLN
INTRAVENOUS | Status: DC | PRN
  Administered 2015-02-15: 50 mg via INTRAVENOUS

## 2015-02-15 MED ORDER — SODIUM CHLORIDE 0.9 % IR SOLN
Status: DC | PRN
  Administered 2015-02-15 (×2): 500 mL

## 2015-02-15 MED ORDER — GUAIFENESIN-DM 100-10 MG/5ML PO SYRP: 15.0000 mL | ORAL_SOLUTION | ORAL | Status: DC | PRN

## 2015-02-15 MED ORDER — DEXAMETHASONE SODIUM PHOSPHATE 4 MG/ML IJ SOLN
INTRAMUSCULAR | Status: DC | PRN
  Administered 2015-02-15: 4 mg via INTRAVENOUS

## 2015-02-15 MED ORDER — VECURONIUM BROMIDE 10 MG IV SOLR
INTRAVENOUS | Status: DC | PRN
  Administered 2015-02-15: 2 mg via INTRAVENOUS

## 2015-02-15 MED ORDER — PROTAMINE SULFATE 10 MG/ML IV SOLN
INTRAVENOUS | Status: AC
  Filled 2015-02-15: qty 5

## 2015-02-15 MED ORDER — PANTOPRAZOLE SODIUM 40 MG PO TBEC
40.0000 mg | DELAYED_RELEASE_TABLET | Freq: Every day | ORAL | Status: DC
  Administered 2015-02-15 – 2015-02-19 (×5): 40 mg via ORAL
  Filled 2015-02-15 (×5): qty 1

## 2015-02-15 MED ORDER — OXYCODONE HCL 5 MG PO TABS: 5.0000 mg | ORAL_TABLET | Freq: Four times a day (QID) | ORAL | Status: DC | PRN

## 2015-02-15 MED ORDER — ENOXAPARIN SODIUM 30 MG/0.3ML ~~LOC~~ SOLN
30.0000 mg | SUBCUTANEOUS | Status: DC
  Administered 2015-02-16 – 2015-02-18 (×3): 30 mg via SUBCUTANEOUS
  Filled 2015-02-15 (×6): qty 0.3

## 2015-02-15 MED ORDER — METOPROLOL TARTRATE 1 MG/ML IV SOLN: 2.0000 mg | INTRAVENOUS | Status: DC | PRN

## 2015-02-15 MED ORDER — FENTANYL CITRATE (PF) 100 MCG/2ML IJ SOLN: 25.0000 ug | INTRAMUSCULAR | Status: DC | PRN

## 2015-02-15 MED ORDER — PROTAMINE SULFATE 10 MG/ML IV SOLN
INTRAVENOUS | Status: DC | PRN
  Administered 2015-02-15: 50 mg via INTRAVENOUS

## 2015-02-15 MED ORDER — LIDOCAINE HCL (CARDIAC) 20 MG/ML IV SOLN
INTRAVENOUS | Status: AC
  Filled 2015-02-15: qty 5

## 2015-02-15 MED ORDER — ACETAMINOPHEN 325 MG PO TABS: 325.0000 mg | ORAL_TABLET | ORAL | Status: DC | PRN

## 2015-02-15 MED ORDER — ACETAMINOPHEN 160 MG/5ML PO SOLN
325.0000 mg | ORAL | Status: DC | PRN
  Filled 2015-02-15: qty 20.3

## 2015-02-15 MED ORDER — VECURONIUM BROMIDE 10 MG IV SOLR
INTRAVENOUS | Status: AC
  Filled 2015-02-15: qty 10

## 2015-02-15 MED ORDER — PHENYLEPHRINE HCL 10 MG/ML IJ SOLN
INTRAMUSCULAR | Status: DC | PRN
  Administered 2015-02-15 (×2): 80 ug via INTRAVENOUS
  Administered 2015-02-15 (×2): 40 ug via INTRAVENOUS

## 2015-02-15 MED ORDER — ONDANSETRON HCL 4 MG/2ML IJ SOLN
INTRAMUSCULAR | Status: AC
Start: 2015-02-15 — End: 2015-02-15
  Filled 2015-02-15: qty 2

## 2015-02-15 MED ORDER — OXYCODONE HCL 5 MG PO TABS: 5.0000 mg | ORAL_TABLET | Freq: Once | ORAL | Status: DC | PRN

## 2015-02-15 MED ORDER — OXYCODONE HCL 5 MG/5ML PO SOLN: 5.0000 mg | Freq: Once | ORAL | Status: DC | PRN

## 2015-02-15 MED ORDER — MAGNESIUM HYDROXIDE 400 MG/5ML PO SUSP: 30.0000 mL | Freq: Every day | ORAL | Status: DC | PRN

## 2015-02-15 MED ORDER — LABETALOL HCL 5 MG/ML IV SOLN
10.0000 mg | INTRAVENOUS | Status: DC | PRN
  Administered 2015-02-15: 10 mg via INTRAVENOUS

## 2015-02-15 MED ORDER — BISACODYL 10 MG RE SUPP: 10.0000 mg | Freq: Every day | RECTAL | Status: DC | PRN

## 2015-02-15 MED ORDER — HYDRALAZINE HCL 20 MG/ML IJ SOLN: 5.0000 mg | INTRAMUSCULAR | Status: DC | PRN

## 2015-02-15 MED ORDER — NEOSTIGMINE METHYLSULFATE 10 MG/10ML IV SOLN
INTRAVENOUS | Status: DC | PRN
Start: 2015-02-15 — End: 2015-02-15
  Administered 2015-02-15: 3 mg via INTRAVENOUS

## 2015-02-15 MED ORDER — IODIXANOL 320 MG/ML IV SOLN
INTRAVENOUS | Status: DC | PRN
  Administered 2015-02-15: 78.42 mL via INTRAVENOUS

## 2015-02-15 MED ORDER — PROPOFOL 10 MG/ML IV BOLUS
INTRAVENOUS | Status: DC | PRN
  Administered 2015-02-15: 150 mg via INTRAVENOUS
  Administered 2015-02-15: 40 mg via INTRAVENOUS

## 2015-02-15 MED ORDER — SODIUM CHLORIDE 0.9 % IJ SOLN
INTRAMUSCULAR | Status: AC
  Filled 2015-02-15: qty 10

## 2015-02-15 MED ORDER — PHENOL 1.4 % MT LIQD: 1.0000 | OROMUCOSAL | Status: DC | PRN

## 2015-02-15 MED ORDER — ONDANSETRON HCL 4 MG/2ML IJ SOLN: 4.0000 mg | Freq: Four times a day (QID) | INTRAMUSCULAR | Status: DC | PRN

## 2015-02-15 MED ORDER — ROCURONIUM BROMIDE 50 MG/5ML IV SOLN
INTRAVENOUS | Status: AC
Start: 2015-02-15 — End: 2015-02-15
  Filled 2015-02-15: qty 1

## 2015-02-15 MED ORDER — SODIUM CHLORIDE 0.9 % IV SOLN: 500.0000 mL | Freq: Once | INTRAVENOUS | Status: DC | PRN

## 2015-02-15 MED ORDER — ONDANSETRON HCL 4 MG/2ML IJ SOLN
INTRAMUSCULAR | Status: DC | PRN
  Administered 2015-02-15: 4 mg via INTRAVENOUS

## 2015-02-15 MED ORDER — DEXAMETHASONE SODIUM PHOSPHATE 4 MG/ML IJ SOLN
INTRAMUSCULAR | Status: AC
  Filled 2015-02-15: qty 1

## 2015-02-15 MED ORDER — MORPHINE SULFATE (PF) 2 MG/ML IV SOLN: 2.0000 mg | INTRAVENOUS | Status: DC | PRN

## 2015-02-15 MED ORDER — FENTANYL CITRATE (PF) 250 MCG/5ML IJ SOLN
INTRAMUSCULAR | Status: AC
Start: 2015-02-15 — End: 2015-02-15
  Filled 2015-02-15: qty 5

## 2015-02-15 MED ORDER — GLYCOPYRROLATE 0.2 MG/ML IJ SOLN
INTRAMUSCULAR | Status: DC | PRN
  Administered 2015-02-15: 0.4 mg via INTRAVENOUS

## 2015-02-15 MED ORDER — LABETALOL HCL 5 MG/ML IV SOLN
INTRAVENOUS | Status: AC
  Filled 2015-02-15: qty 4

## 2015-02-15 MED ORDER — POTASSIUM CHLORIDE CRYS ER 20 MEQ PO TBCR
20.0000 meq | EXTENDED_RELEASE_TABLET | Freq: Every day | ORAL | Status: DC | PRN
Start: 2015-02-15 — End: 2015-02-19

## 2015-02-15 MED ORDER — DEXTROSE 5 % IV SOLN
1.5000 g | Freq: Two times a day (BID) | INTRAVENOUS | Status: AC
  Administered 2015-02-15 – 2015-02-16 (×2): 1.5 g via INTRAVENOUS
  Filled 2015-02-15 (×2): qty 1.5

## 2015-02-15 MED ORDER — ACETAMINOPHEN 650 MG RE SUPP: 325.0000 mg | RECTAL | Status: DC | PRN

## 2015-02-15 MED ORDER — LIDOCAINE HCL (CARDIAC) 20 MG/ML IV SOLN
INTRAVENOUS | Status: DC | PRN
  Administered 2015-02-15 (×2): 50 mg via INTRAVENOUS

## 2015-02-15 MED ORDER — ALUM & MAG HYDROXIDE-SIMETH 200-200-20 MG/5ML PO SUSP: 15.0000 mL | ORAL | Status: DC | PRN

## 2015-02-15 MED ORDER — DOCUSATE SODIUM 100 MG PO CAPS
100.0000 mg | ORAL_CAPSULE | Freq: Every day | ORAL | Status: DC
  Administered 2015-02-16 – 2015-02-19 (×4): 100 mg via ORAL
  Filled 2015-02-15 (×6): qty 1

## 2015-02-15 MED ORDER — ACETAMINOPHEN 325 MG PO TABS
325.0000 mg | ORAL_TABLET | ORAL | Status: DC | PRN
  Administered 2015-02-19: 650 mg via ORAL
  Filled 2015-02-15: qty 2

## 2015-02-15 MED ORDER — DEXTROSE 5 % IV SOLN
10.0000 mg | INTRAVENOUS | Status: DC | PRN
  Administered 2015-02-15: 25 ug/min via INTRAVENOUS

## 2015-02-15 MED ORDER — FENTANYL CITRATE (PF) 100 MCG/2ML IJ SOLN
INTRAMUSCULAR | Status: DC | PRN
  Administered 2015-02-15: 50 ug via INTRAVENOUS
  Administered 2015-02-15: 150 ug via INTRAVENOUS

## 2015-02-15 MED ORDER — HEPARIN SODIUM (PORCINE) 1000 UNIT/ML IJ SOLN
INTRAMUSCULAR | Status: DC | PRN
  Administered 2015-02-15: 7000 [IU] via INTRAVENOUS
  Administered 2015-02-15 (×2): 2000 [IU] via INTRAVENOUS

## 2015-02-15 MED ORDER — SODIUM CHLORIDE 0.9 % IV SOLN
INTRAVENOUS | Status: DC
  Administered 2015-02-15 – 2015-02-19 (×4): via INTRAVENOUS

## 2015-02-15 SURGICAL SUPPLY — 87 items
ARMADA 35 PTA CATHETER ×1 IMPLANT
BAG DECANTER FOR FLEXI CONT (MISCELLANEOUS) IMPLANT
BALLN ARMADA 10X20X80 (BALLOONS) ×2
BALLN CODA OCL 2-9.0-35-120-3 (BALLOONS) ×2
BALLOON ARMADA 10X20X80 (BALLOONS) IMPLANT
BALLOON COD OCL 2-9.0-35-120-3 (BALLOONS) IMPLANT
CANISTER SUCTION 2500CC (MISCELLANEOUS) ×2 IMPLANT
CATH ANGIO 5F BER 65CM (CATHETERS) ×1 IMPLANT
CATH ANGIO 5F BER2 100CM (CATHETERS) ×1 IMPLANT
CATH ANGIO 5F BER2 65CM (CATHETERS) ×1 IMPLANT
CATH OMNI FLUSH .035X70CM (CATHETERS) ×1 IMPLANT
CATH SOFT-VU 4F 65 STRAIGHT (CATHETERS) IMPLANT
CATH SOFT-VU STRAIGHT 4F 65CM (CATHETERS) ×2
COVER PROBE W GEL 5X96 (DRAPES) ×1 IMPLANT
DEVICE CLOSURE PERCLS PRGLD 6F (VASCULAR PRODUCTS) ×4 IMPLANT
DEVICE TORQUE H2O (MISCELLANEOUS) ×1 IMPLANT
DRAIN CHANNEL 10F 3/8 F FF (DRAIN) IMPLANT
DRAIN CHANNEL 10M FLAT 3/4 FLT (DRAIN) IMPLANT
DRAPE ZERO GRAVITY STERILE (DRAPES) ×2 IMPLANT
DRSG TEGADERM 2-3/8X2-3/4 SM (GAUZE/BANDAGES/DRESSINGS) ×3 IMPLANT
DRYSEAL FLEXSHEATH 16FR 33CM (SHEATH) ×1
DRYSEAL FLEXSHEATH 22FR 33CM (SHEATH) ×1
ELECT REM PT RETURN 9FT ADLT (ELECTROSURGICAL) ×4
ELECTRODE REM PT RTRN 9FT ADLT (ELECTROSURGICAL) ×2 IMPLANT
EVACUATOR 3/16  PVC DRAIN (DRAIN)
EVACUATOR 3/16 PVC DRAIN (DRAIN) IMPLANT
EVACUATOR SILICONE 100CC (DRAIN) IMPLANT
GAUZE SPONGE 2X2 8PLY STRL LF (GAUZE/BANDAGES/DRESSINGS) ×2 IMPLANT
GAUZE SPONGE 4X4 16PLY XRAY LF (GAUZE/BANDAGES/DRESSINGS) ×1 IMPLANT
GLOVE BIO SURGEON STRL SZ7 (GLOVE) ×1 IMPLANT
GLOVE BIOGEL PI IND STRL 6.5 (GLOVE) IMPLANT
GLOVE BIOGEL PI IND STRL 7.0 (GLOVE) IMPLANT
GLOVE BIOGEL PI IND STRL 7.5 (GLOVE) ×1 IMPLANT
GLOVE BIOGEL PI INDICATOR 6.5 (GLOVE) ×3
GLOVE BIOGEL PI INDICATOR 7.0 (GLOVE) ×3
GLOVE BIOGEL PI INDICATOR 7.5 (GLOVE) ×2
GLOVE ECLIPSE 6.5 STRL STRAW (GLOVE) ×3 IMPLANT
GLOVE SS BIOGEL STRL SZ 6.5 (GLOVE) IMPLANT
GLOVE SUPERSENSE BIOGEL SZ 6.5 (GLOVE) ×1
GLOVE SURG SS PI 7.5 STRL IVOR (GLOVE) ×2 IMPLANT
GOWN STRL REUS W/ TWL LRG LVL3 (GOWN DISPOSABLE) ×5 IMPLANT
GOWN STRL REUS W/ TWL XL LVL3 (GOWN DISPOSABLE) ×1 IMPLANT
GOWN STRL REUS W/TWL LRG LVL3 (GOWN DISPOSABLE) ×10
GOWN STRL REUS W/TWL XL LVL3 (GOWN DISPOSABLE) ×2
GRAFT BALLN CATH 65CM (STENTS) IMPLANT
GRAFT FEN DIST EVAR 20X62X76 (Graft) ×1 IMPLANT
GRAFT FEN PROX EVAR 26X124M (Graft) ×1 IMPLANT
GRAFT LEG ILIAC ZSLE-20-74-ZT (Endovascular Graft) ×2 IMPLANT
GUIDEWIRE ANGLED .035X150CM (WIRE) ×1 IMPLANT
HEMOSTAT SNOW SURGICEL 2X4 (HEMOSTASIS) IMPLANT
KIT BASIN OR (CUSTOM PROCEDURE TRAY) ×2 IMPLANT
KIT ENCORE 26 ADVANTAGE (KITS) ×1 IMPLANT
KIT ROOM TURNOVER OR (KITS) ×2 IMPLANT
LIQUID BAND (GAUZE/BANDAGES/DRESSINGS) ×3 IMPLANT
NEEDLE PERC 18GX7CM (NEEDLE) ×2 IMPLANT
NS IRRIG 1000ML POUR BTL (IV SOLUTION) ×2 IMPLANT
PACK ENDOVASCULAR (PACKS) ×2 IMPLANT
PAD ARMBOARD 7.5X6 YLW CONV (MISCELLANEOUS) ×4 IMPLANT
PERCLOSE PROGLIDE 6F (VASCULAR PRODUCTS) ×8
SHEATH AVANTI 11CM 8FR (MISCELLANEOUS) ×1 IMPLANT
SHEATH BRITE TIP 8FR 23CM (MISCELLANEOUS) ×2 IMPLANT
SHEATH DRYSEAL FLEX 16FR 33CM (SHEATH) IMPLANT
SHEATH DRYSEAL FLEX 22FR 33CM (SHEATH) IMPLANT
SHEATH HIGHFLEX ANSEL 6FRX55 (SHEATH) ×1 IMPLANT
SHIELD RADPAD SCOOP 12X17 (MISCELLANEOUS) ×2 IMPLANT
SLEEVE SURGEON STRL (DRAPES) ×1 IMPLANT
SPONGE GAUZE 2X2 STER 10/PKG (GAUZE/BANDAGES/DRESSINGS) ×2
STENT GRAFT BALLN CATH 65CM (STENTS)
STENT ICAST 6X22X120 (Permanent Stent) ×2 IMPLANT
STOPCOCK MORSE 400PSI 3WAY (MISCELLANEOUS) ×2 IMPLANT
SUT ETHILON 3 0 PS 1 (SUTURE) IMPLANT
SUT PROLENE 5 0 C 1 24 (SUTURE) IMPLANT
SUT SILK 2 0 FS (SUTURE) ×2 IMPLANT
SUT VIC AB 2-0 CT1 27 (SUTURE)
SUT VIC AB 2-0 CT1 TAPERPNT 27 (SUTURE) IMPLANT
SUT VIC AB 3-0 SH 27 (SUTURE)
SUT VIC AB 3-0 SH 27X BRD (SUTURE) IMPLANT
SUT VICRYL 4-0 PS2 18IN ABS (SUTURE) ×4 IMPLANT
SYR 30ML LL (SYRINGE) IMPLANT
TRAY FOLEY W/METER SILVER 16FR (SET/KITS/TRAYS/PACK) ×2 IMPLANT
TUBING HIGH PRESSURE 120CM (CONNECTOR) ×2 IMPLANT
WIRE AMPLATZ SS-J .035X180CM (WIRE) IMPLANT
WIRE BENTSON .035X145CM (WIRE) ×2 IMPLANT
WIRE HI TORQ VERSACORE J 260CM (WIRE) ×1 IMPLANT
WIRE ROSEN-J .035X260CM (WIRE) ×1 IMPLANT
WIRE SPARTACORE .014X190CM (WIRE) ×1 IMPLANT
WIRE STIFF LUNDERQUIST 260MM (WIRE) ×6 IMPLANT

## 2015-02-15 NOTE — Op Note (Signed)
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 Blood Loss:  See anesthesia record Specimens:  None  Findings:  Complete exclusion.  The patient had a 75% stenosis in the left renal artery which resolved after stent deployment  Devices used: Main body was primary right ZFEN-P 2-26-124.  ZFEN-D 20-62-76.  Contralateral left ZSLE 20-74.  L renal stent ICASt 6x22   Indications:  The patient has a 6 cm juxtarenal abdominal aortic aneurysm.  He comes in today for repair.  The risks and benefits of the procedure were discussed with the patient, as documented in the preprocedure note  Procedure:  The patient was identified in the holding area and taken to [place] 16  The patient was then placed supine on the table. general anesthesia was administered.  The patient was prepped and draped in the usual sterile fashion.  A time out was called and antibiotics were administerUltrasound was used to evaluate bilateral common femoral arteries which are widely patent with minimal calcification.  A skin nick was made bilaterally.  Under ultrasound guidance, bilateral common femoral arteries were cannulated with an 18-gauge needle.  A 035 wire was advanced without resistance.  The subcutaneous tract was dilated with a [place] dilator.  Provide devices were deployed at the 11:00 and 1:00 position for pre-closure.  The patient was given systemic heparinization.  8 French sheaths were placed bilaterally.  A Lunderquist wire was advanced up the right  side as well as the left.  A 16 French dry seal sheath was advanced up the left side.  Through this sheath the left renal artery was pre-cannulated with a Benson wire and a BER catheter.  We tried to get a 014 wire in the renal artery but were unable to.  Therefore we elected to abort pre-cannulation of the left renal artery.  Next a Omni flush catheter was advanced to the level of L-1.  The main body was then prepared on the back table.  It was then advanced up the right side.  This was a ZFEN 2-26-124.  An abdominal aortogram was performed.  This located the left renal artery.  This was done at a 25 LAO projection.  The marker for the left renal scalp was positioned appropriately and then the top 2 stents of the device were deployed.  An additional Joetta Manners was performed confirming that the scallop was in the proper location and oriented correctly.  The remaining stents were then deployed.  The Omni flush catheter was brought down behind the graft and then the main device was re-cannulated.  The 33 French sheath was advanced into the main body.  Next a 6 [place] Ansel 1 high flex sheath was advanced through the 16 French sheath into the main graft.  Using the  BER catheter and a Glidewire, the left renal artery was cannulated through the scallop.  The catheter was then advanced out into the mid renal artery across a proximal stenosis.  Contrast injection was performed at this location confirming that we were in the main renal artery.  A Rosen wire was then placed.  The catheter was removed and the dilator was inserted into the 6 French sheath  which was advanced out into the mid left renal artery.  A  ICAST 6 x 22 stent was then advanced out and left at the tip of the Ansel sheath.  T At this point the suprarenal stent was deployed.  The main body device was then removed.  Through the sheath on the right side a Coda balloon was used to mold the top portion of the graft.  Next, the ICAST 6x22 stent was deployed  leaving a few millimeters into the aorta.  The stent was then flared using a 10 x 20 balloon.  Next the distal main body was prepared on the back table and advanced up the wire on the right side after the delivery system had been removed for the proximal piece.  The distal piece was  ZFEN D 20-62-76.  We made sure that the contralateral gate opened above the aortic bifurcation.  I was concerned that this device may have disrupted the layer of the renal stent and therefore this was re-molded with a 10 x 20 balloon.  The distal main body was then deployed down to the contralateral gate.  Contralateral gate was then cannulated with a Omni flush catheter and a Glidewire.  To confirm that the gate was cannulated properly a Coda balloon was inserted and inflated this area.  Inflated appropriately.  A Lunderquist wire was then inserted.  The image detector was rotated to a right anterior oblique position and a retrograde injection was performed locating the left hypogastric artery.  The distal left extension was selected to be a ZSLE 20-74.  This is advanced through the sheath and then deployed landing just proximal to the hypogastric artery on the left.  Next a retrograde injection was performed from the sheath on the right side with the image detector and a LAO projection locating the right hypogastric artery.  The ipsilateral limb wasn't fully deployed.  A Coda balloon was used to mold the device overlap as well as the distal extensions.  Completion Joetta Manners was then performed.  This showed a widely patent right renal and left renal artery stent.  The main body device was in good position.  There was no type I or type III endoleak and there was preservation of the hypogastric arteries.  At this point is very happy with our repair.  Bentson wires were placed bilaterally.  The sheaths were removed and the cannulation site closed using the previously deployed proglide devices.  Both groins were hemostatic.  50 mg of  protamine was administered.  The incisions were closed with 40 Vicodin Dermabond.  The patient had excellent Doppler signals in the dorsalis pedis artery bilaterally.  There were no immediate complications.     Disposition:  To PACU in stable condition.   Theotis Burrow, M.D. [facility] [place] Office: [phone] Pager:  [phone]

## 2015-02-15 NOTE — Anesthesia Preprocedure Evaluation (Signed)
Anesthesia Evaluation  Patient identified by MRN, date of birth, ID band Patient awake    Reviewed: Allergy & Precautions, NPO status , Patient's Chart, lab work & pertinent test results  History of Anesthesia Complications Negative for: history of anesthetic complications  Airway Mallampati: III  TM Distance: >3 FB Neck ROM: Full    Dental  (+) Teeth Intact   Pulmonary neg pulmonary ROS,  breath sounds clear to auscultation        Cardiovascular hypertension, + CAD, + CABG and + Peripheral Vascular Disease + dysrhythmias + pacemaker Rhythm:Regular     Neuro/Psych  Neuromuscular disease negative psych ROS   GI/Hepatic negative GI ROS, Neg liver ROS,   Endo/Other  negative endocrine ROS  Renal/GU Renal InsufficiencyRenal disease     Musculoskeletal negative musculoskeletal ROS (+)   Abdominal   Peds  Hematology  (+) anemia ,   Anesthesia Other Findings   Reproductive/Obstetrics                             Anesthesia Physical Anesthesia Plan  ASA: III  Anesthesia Plan: General   Post-op Pain Management:    Induction: Intravenous  Airway Management Planned: Oral ETT  Additional Equipment: None  Intra-op Plan:   Post-operative Plan: Extubation in OR  Informed Consent: I have reviewed the patients History and Physical, chart, labs and discussed the procedure including the risks, benefits and alternatives for the proposed anesthesia with the patient or authorized representative who has indicated his/her understanding and acceptance.   Dental advisory given  Plan Discussed with: CRNA and Surgeon  Anesthesia Plan Comments:         Anesthesia Quick Evaluation

## 2015-02-15 NOTE — Anesthesia Procedure Notes (Signed)
Procedure Name: Intubation Date/Time: 02/15/2015 11:16 AM Performed by: Duke Salvia Pre-anesthesia Checklist: Patient identified, Emergency Drugs available, Suction available and Patient being monitored Patient Re-evaluated:Patient Re-evaluated prior to inductionOxygen Delivery Method: Circle system utilized Preoxygenation: Pre-oxygenation with 100% oxygen Intubation Type: IV induction Ventilation: Oral airway inserted - appropriate to patient size Laryngoscope Size: Mac and 4 Grade View: Grade I Tube type: Oral Tube size: 7.5 mm Number of attempts: 1 Airway Equipment and Method: Stylet Placement Confirmation: ETT inserted through vocal cords under direct vision,  positive ETCO2 and breath sounds checked- equal and bilateral Secured at: 22 cm Tube secured with: Tape Dental Injury: Injury to tongue and Injury to lip

## 2015-02-15 NOTE — Progress Notes (Signed)
  Day of Surgery Note    Subjective:  "I feel like a bull hit me in the groin, but other than that I am fine"  Filed Vitals:   02/15/15 1530  BP: 148/72  Pulse: 65  Temp:   Resp: 14    Incisions:   Bilateral groins are soft without hematoma Extremities:  Bilateral feet are warm with palpable DP pulse bilaterally Cardiac:  regular Lungs:  Non labored   Assessment/Plan:  This is a 81 y.o. male who is s/p EVAR with fenestrated graft  -pt doing well this afternoon -palpable pedal pulses bilaterally -renal duplex tomorrow -to 3 south when bed available   Leontine Locket, PA-C 02/15/2015 3:45 PM

## 2015-02-15 NOTE — Op Note (Addendum)
OPERATIVE NOTE   PROCEDURE: 1. Left common femoral artery cannulation under ultrasound guidance 2. "Preclose" repair of left common femoral artery  3. Placement of catheter in aorta 4. First order selection of left renal artery 5. Stenting and angioplasty of left renal artery (iCast 6 mm x 22 mm) 6. Deployment of left iliac limb (ZSLE-20-74-ZT)  (See Dr. Stephens Shire Operative Note for additional charges)  PRE-OPERATIVE DIAGNOSIS: large abdominal aortic aneurysm   POST-OPERATIVE DIAGNOSIS: same as above   CO-SURGEONS: Harold Barban, MD; Adele Barthel, MD  ANESTHESIA: general  ESTIMATED BLOOD LOSS: 100 cc  FINDING(S): 1.  >75% stenosis in Left renal artery: resolved after stenting 2.  No endoleak at end of case 3.  Persistent flow to both renal arteries and both internal iliac arteries 4.  Dopplerable pedal signals at end of case  SPECIMEN(S):  none  INDICATIONS:   Edward Cannon is a 81 y.o. male who presents with large abdominal aortic aneurysm.  The patient was offered fenestrated endovascular aortic repair by Dr. Trula Slade.  The patient is aware the risks of endovascular aortic surgery include but are not limited to: bleeding, need for transfusion, infection, death, stroke, paralysis, wound complications, bowel ischemia, extended ventilation, anaphylactic reaction to contrast, contrast induced nephropathy, embolism, renal failure, occlusion of mesenteric and renal arteries, and need for additional procedure to address endoleaks.     DESCRIPTION: After obtaining full informed written consent, the patient was brought back to the operating room and placed supine upon the operating table.  The patient received IV antibiotics prior to induction.  After obtaining adequate anesthesia, the patient was prepped and draped in the standard fashion for: open or endovascular aortic repair.  A co-surgeon technique was utilized due to the complexity of this fenestrated endograft placement and  the need to maintain constant control of the various components of the endograft device during the case.    Dr. Trula Slade obtained access in the right groin and utilized the "Preclose" technique.  He placed a short 8-Fr sheath in the right groin.  Details can be obtained from his note.  I then cannulated the left common femoral artery under Sonosite guidance.  I loaded a Benson wire into the aorta.  The subcutaneous tract was dilated with a 8-Fr dilator.  I loaded a Proglide device and deployed it with 30 degree medial rotation.  I then reloaded the [facility] wire and exchanged the used Proglide for a new Proglide.  The new Proglide was deployed at a 30 degree lateral rotation.  The [facility] wire was replaced and the used Proglide removed.  I loaded a long 8-Fr sheath into this common femoral artery.  Both wires were steering into the suprarenal aorta and exchanged with catheters for Lundiquist wires.  The left sheath was exchanged for a 16-Fr Dryseal sheath.  I then placed a BER-1 catheter with a Benson through the sheath.  I cannulated the left renal artery without difficulty.  I exchanged the wire for a 0.014" Spartacore wire but the wire would not pass the >75% stenosis in the left renal artery.  I removed both the wire and catheter.    At this point, Dr. Trula Slade exchanged the right sheath for the [facility] fenestrated graft.  Details can be found in his notes.  I loaded an Omniflush catheter over a Benson wire into the suprarenal position.  An aortogram was completed to demonstrate renal position. Dr. Trula Slade deployed the fenestrated graft (ZFEN-P-2-26-124-R) in the suprarenal position, positioning the scalloped opening  oriented to the left renal artery.  At this point, I loaded a 6-Fr angulated Ansel sheath through the Dryseal sheath over a Glidewire.  These were advanced into the fenestrated graft together.  I advanced the sheath into the proximal portion of the restrained fenestrated graft.  I then loaded a BER-1  catheter through the sheath and cannulated the left renal artery.  With some manipulation, the catheter was lodged into the proximal left renal artery.  The wire was advanced into the second order renal bifurcation.  The catheter was exchanged for a straight catheter.  The wire was exchanged for a Rosen wire.  The sheath's dilator was reloaded.  The sheath with dilator was then advanced into the proximal renal artery.  A 6 mm x 22 mm iCAST stent was then advanced into the proximal left renal artery.  The iCAST was deployed at 12 atm.  The balloon was exchanged for a 10 mm balloon which was inflated to flare the intra-aortic segment of the iCAST stent. The balloon was removed.  At this point, Dr. Trula Slade fully deployed the fenestrated graft and released the endograft from its delivery system.  This was removed, leaving the sheath in place in the right groin.  A Coda balloon was used to mold the graft at the renal level.  At this point, the main body device (ZFEN-D-20-62-76-C) was loaded into the fenestrated graft with 4 stent overlap.  I did a retrograde hand injection from the left sheath to identify the right internal iliac artery position.  Dr. Trula Slade deployed the main body down to the contralateral gate, taking into account the right internal iliac artery position.  At this point, the 10 mm balloon was replaced in into the left renal stent and inflated to reflare the stent.  The Ansel sheath, balloon, and Rosen wire were removed from the left renal artery.    At this point, Dr. Trula Slade cannulated the contralateral gate with a Glidewire and Omniflush catheter.  The Omniflush was advanced into the main body.  A verification maneuver was completed to demonstrate successful cannulation of the main body.  See Dr. Stephens Shire notes for details.  A Lundiquest wire was advanced into the suprarenal segment from the left groin.  A marker pigtail was loaded over the wire to the flow divider.  I pulled the left sheath  down to distal to the left internal iliac artery.  A retrograde injections demonstrated the position of the left internal iliac artery.  The left iliac limb was selected based on the measurement.  The catheter was removed and then the sheath was exchanged for the left iliac limb (ZSLE-20-74-ZT).  I placed the left iliac limb with adequate overlap into the main body.  The limb was deployed proximal to the left internal iliac artery takeoff.  The delivery device was removed leaving a long sheath on the left side.  At this point, Dr. Trula Slade fully deployed the ipsilateral limb of the main body.  The stent delivery device was not backing out in a smooth fashion, so the entire sheath with stent delivery device was exchanged for a 20-Fr Dryseal sheath on the right side.  At this point, a Coda balloon was placed over the left wire.  I molded the renal segment, the overlapping segments, and the entire left iliac limb.  The balloon was deflated and removed.  Dr. Trula Slade replaced the balloon and molded the entire right iliac limb.  The balloon was deflated and removed.  A pigtail catheter was  loaded over the right catheter into the suprarenal catheter.  The catheter was connected to the power injector.  Completion aortogram demonstrated: no endoleak, continued perfusion to both renal arteries and both internal iliac arteries.    At this point, all wires were exchanged via catheters for [facility] wires.  The left sheath was removed while Dr. Trula Slade held pressure on the left common femoral artery.  I repaired the left common femoral artery by serially tightening down the Proglide sutures.  After the first round, there was no further bleeding from left groin.  I removed the wire from the left common femoral artery and then serially tightened the Proglide sutures again, this time locking the sutures.  All sutures were placed under tension with a hemostat.  Dr. Trula Slade repeated this process on the right side, completing the  Preclose repair of that artery.  The patient was given Protamine to reverse anticoagulation.  After a few minutes, no further bleeding occurred.  Each groin was repaired with a running subcuticular stitch of 4-0 Vicryl.  The skin was cleaned, dried, and reinforced with Dermabond in both groins.  Distally, pedal signals were easily identified with continuous doppler.   COMPLICATIONS: none  CONDITION: stable   Adele Barthel, MD [facility] [place] Office: [phone] Pager: [phone]  02/15/2015, 3:38 PM

## 2015-02-15 NOTE — Transfer of Care (Signed)
Immediate Anesthesia Transfer of Care Note  Patient: Edward Cannon  Procedure(s) Performed: Procedure(s): ABDOMINAL AORTIC ENDOVASCULAR FENESTRATED STENT GRAFT (N/A)  Patient Location: PACU  Anesthesia Type:General  Level of Consciousness: awake, alert , oriented and patient cooperative  Airway & Oxygen Therapy: Patient Spontanous Breathing and Patient connected to face mask oxygen  Post-op Assessment: Report given to RN, Post -op Vital signs reviewed and stable and Patient moving all extremities  Post vital signs: Reviewed and stable  Last Vitals:  Filed Vitals:   02/15/15 1515  BP: 146/74  Pulse: 64  Temp:   Resp: 12    Complications: No apparent anesthesia complications

## 2015-02-15 NOTE — H&P (Signed)
Patient name: Edward Galligher KingMRN: [id number] DOB: 1927-10-31Sex: male    Chief Complaint  Patient presents with  . 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 He denies any abdominal pain or back pain.  The patient suffers hyperlipidemia. He has a statin allergy. He is on an ARB for hypertension. He is a nonsmoker. He has a pacemaker for sick sinus syndrome. He is status post CABG  Past Medical History  Diagnosis Date  . Hypertension   . Hyperlipidemia   . Microscopic hematuria   . Coronary artery disease S/P CABG   . Atrial fibrillation   . Bouchard nodes (DJD hand)   . Hx of colonic polyps   . Ventral hernia     Asymptomatic  . Diverticulosis     Wtih Anatomotic friability on colonscopy  . Urinary urgency     With Incontinence  . BPH (benign prostatic hypertrophy)   . 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 Syncope     Recurrent, second to Bradycardia  . Sinus node dysfunction   . Anginal pain 2005  . Pacemaker   . History of bronchitis winter 2012  . H/O scarlet fever 1936  . Leg weakness, bilateral 10/09/2014  . Abnormality of gait 10/09/2014  . AAA (abdominal aortic aneurysm) 10/04/14    Per Dr. Viviana Simpler    Past Surgical History  Procedure Laterality Date  . [place]      Obtuse marginal branch of the left circumflex   . [place]      to the LAD  . Svg  2005    to posterior lateral branch of the RCA  . Low anterior bowel resection      for large villous  . Colon resection  ~ 2005  . Coronary artery bypass graft  ~ 2009    CABG X3  . Mastoidectomy  1930  . Cardiac catheterization    . Colon surgery     . Insert / replace / remove pacemaker  12/22/11  . Permanent pacemaker insertion N/A 12/22/2011    Procedure: PERMANENT PACEMAKER INSERTION; Surgeon: Deboraha Sprang, MD; Location: [facility] CATH LAB; Service: Cardiovascular; Laterality: N/A;  . Mastoid surgery    . Cataract extraction Bilateral   . Eye surgery Bilateral 2012    History   Social History  . Marital Status: Married    Spouse Name: N/A  . Number of Children: 1  . Years of Education: PhD   Occupational History  . retired    Social History Main Topics  . Smoking status: Never Smoker   . Smokeless tobacco: Never Used  . Alcohol Use: 2.4 oz/week    4 Glasses of wine per week     Comment: 12/22/11 "I drink 2 oz red wine qd at the suggestion of my cardiologist"  . Drug Use: No  . Sexual Activity: No   Other Topics Concern  . Not on file   Social History Narrative   Patient is right handed.   Patient does not drink caffeine.    Family History  Problem Relation Age of Onset  . Diabetes Mother   . Heart disease Mother     Pacemaker  . CAD Mother   . COPD Father   . Prostate cancer Father   . Pancreatic cancer Brother   .  Other Brother     CABG  . Heart failure Brother   . Coronary artery disease Brother     Allergies as of 12/01/2014 - Review Complete 12/01/2014  Allergen Reaction Noted  . Crestor [rosuvastatin] Other (See Comments) 12/11/2011  . Hygroton [chlorthalidone] Other (See Comments) 12/11/2011  . Lipitor [atorvastatin] Other (See Comments) 12/11/2011  . Metamucil [psyllium] Other (See Comments) 12/11/2011  . Mevacor [lovastatin] Other (See Comments) 12/11/2011  . Pravastatin Other (See Comments) 12/11/2011  . Vytorin [ezetimibe-simvastatin] Other (See Comments) 12/11/2011  . Zetia [ezetimibe] Other (See Comments) 12/11/2011  .  Toviaz [fesoterodine fumarate er]  10/06/2014    Current Outpatient Prescriptions on File Prior to Visit  Medication Sig Dispense Refill  . amLODipine (NORVASC) 2.5 MG tablet     . AMLODIPINE BESYLATE PO Take 2.5 mg by mouth.    . Aspirin 81 MG EC tablet Take 81 mg by mouth every evening.     . cholecalciferol (VITAMIN D) 1000 UNITS tablet Take 1,000 Units by mouth every other day.    . cholestyramine (QUESTRAN) 4 G packet Take 1 packet by mouth 2 (two) times daily. Take in 4 oz orange juice followed by 8 oz water    . clotrimazole-betamethasone (LOTRISONE) cream Apply 1 application topically 2 (two) times daily.    . Multiple Vitamin (MULTIVITAMIN) tablet Take 1 tablet by mouth every other day. Without iron    . nitroGLYCERIN (NITROSTAT) 0.4 MG SL tablet Place 0.4 mg under the tongue every 5 (five) minutes x 3 doses as needed. For chest pain    . Tamsulosin HCl (FLOMAX) 0.4 MG CAPS Take 0.4 mg by mouth every morning.     . valsartan-hydrochlorothiazide (DIOVAN-HCT) 160-25 MG per tablet 0.5 tablets daily.     . vitamin E (VITAMIN E) 400 UNIT capsule Take 400 Units by mouth every morning.     . vitamin k 100 MCG tablet Take 50 mcg by mouth once a week. Mondays    . mirabegron ER (MYRBETRIQ) 25 MG TB24 tablet Take 25 mg by mouth daily.     No current facility-administered medications on file prior to visit.     REVIEW OF SYSTEMS: No changes from most recent visit  PHYSICAL EXAMINATION:  Vital signs are  Filed Vitals:   12/01/14 0846 12/01/14 0848  BP: 160/76 142/70  Pulse: 85 69  Resp: 14   Height: 5\' 5"  (1.651 m)   Weight: 156 lb (70.761 kg)    Body mass index is 25.96 kg/(m^2). General: The patient appears their stated age. HEENT: No gross abnormalities Pulmonary: Non labored breathing Abdomen: Soft and non-tender Musculoskeletal: There are no major deformities. Neurologic:  No focal weakness or paresthesias are detected, Skin: There are no ulcer or rashes noted. Psychiatric: The patient has normal affect. Cardiovascular: There is a regular rate and rhythm without significant murmur appreciated.   Diagnostic Studies Carotid Doppler studies: 07-24-47 percent bilateral stenosis. ABIs: Triphasic waveforms, 1.0 popliteal arteries are normal diameter  Assessment: Juxtarenal abdominal aortic aneurysm Plan: I discussed proceeding with fenestrated endovascular repair of his juxtarenal abdominal aortic aneurysm. We discussed the risks and benefits including the risk of intestinal complications, renal complications and lower extremity complications. After a well-informed discussion including multiple drawings, he is willing to proceed. I'm going to order his device with plans for repair with the device comes in likely in 6 weeks.  Eldridge Abrahams, M.D. [facility] Office: [phone] Pager: [phone]  No interval changes.  The patient is here today for fenestrated repair of his 5.9 cm aneurysm.  [name]

## 2015-02-16 ENCOUNTER — Inpatient Hospital Stay (HOSPITAL_COMMUNITY): Payer: Medicare Other

## 2015-02-16 ENCOUNTER — Encounter (HOSPITAL_COMMUNITY): Payer: Self-pay | Admitting: Surgery

## 2015-02-16 DIAGNOSIS — Z9889 Other specified postprocedural states: Secondary | ICD-10-CM

## 2015-02-16 LAB — POCT I-STAT 7, (LYTES, BLD GAS, ICA,H+H)
Acid-Base Excess: 3 mmol/L — ABNORMAL HIGH (ref 0.0–2.0)
BICARBONATE: 27.8 meq/L — AB (ref 20.0–24.0)
Calcium, Ion: 1.15 mmol/L (ref 1.13–1.30)
HEMATOCRIT: 27 % — AB (ref 39.0–52.0)
Hemoglobin: 9.2 g/dL — ABNORMAL LOW (ref 13.0–17.0)
O2 Saturation: 100 %
PCO2 ART: 42 mmHg (ref 35.0–45.0)
Patient temperature: 36.2
Potassium: 3.8 mmol/L (ref 3.5–5.1)
SODIUM: 134 mmol/L — AB (ref 135–145)
TCO2: 29 mmol/L (ref 0–100)
pH, Arterial: 7.426 (ref 7.350–7.450)
pO2, Arterial: 206 mmHg — ABNORMAL HIGH (ref 80.0–100.0)

## 2015-02-16 LAB — BASIC METABOLIC PANEL
Anion gap: 7 (ref 5–15)
BUN: 23 mg/dL — ABNORMAL HIGH (ref 6–20)
CALCIUM: 8.2 mg/dL — AB (ref 8.9–10.3)
CHLORIDE: 99 mmol/L — AB (ref 101–111)
CO2: 26 mmol/L (ref 22–32)
Creatinine, Ser: 1.85 mg/dL — ABNORMAL HIGH (ref 0.61–1.24)
GFR, EST AFRICAN AMERICAN: 36 mL/min — AB (ref >60)
GFR, EST NON AFRICAN AMERICAN: 31 mL/min — AB (ref >60)
Glucose, Bld: 129 mg/dL — ABNORMAL HIGH (ref 65–99)
POTASSIUM: 4.2 mmol/L (ref 3.5–5.1)
SODIUM: 132 mmol/L — AB (ref 135–145)

## 2015-02-16 LAB — CBC
HCT: 26.7 % — ABNORMAL LOW (ref 39.0–52.0)
HEMOGLOBIN: 9.2 g/dL — AB (ref 13.0–17.0)
MCH: 33.2 pg (ref 26.0–34.0)
MCHC: 34.5 g/dL (ref 30.0–36.0)
MCV: 96.4 fL (ref 78.0–100.0)
Platelets: 92 10*3/uL — ABNORMAL LOW (ref 150–400)
RBC: 2.77 MIL/uL — AB (ref 4.22–5.81)
RDW: 12.7 % (ref 11.5–15.5)
WBC: 7.6 10*3/uL (ref 4.0–10.5)

## 2015-02-16 MED ORDER — ASPIRIN 81 MG PO CHEW
81.0000 mg | CHEWABLE_TABLET | Freq: Every evening | ORAL | Status: DC
  Administered 2015-02-16 – 2015-02-18 (×3): 81 mg via ORAL
  Filled 2015-02-16 (×5): qty 1

## 2015-02-16 MED ORDER — AMLODIPINE BESYLATE 2.5 MG PO TABS
2.5000 mg | ORAL_TABLET | Freq: Every day | ORAL | Status: DC
  Administered 2015-02-16 – 2015-02-19 (×4): 2.5 mg via ORAL
  Filled 2015-02-16 (×4): qty 1

## 2015-02-16 MED ORDER — IRBESARTAN 300 MG PO TABS
300.0000 mg | ORAL_TABLET | Freq: Every day | ORAL | Status: DC
  Administered 2015-02-16 – 2015-02-19 (×4): 300 mg via ORAL
  Filled 2015-02-16 (×5): qty 1
  Filled 2015-02-16: qty 2

## 2015-02-16 MED ORDER — SODIUM CHLORIDE 0.9 % IV SOLN
INTRAVENOUS | Status: DC
  Administered 2015-02-16: 19:00:00 via INTRAVENOUS

## 2015-02-16 MED ORDER — VALSARTAN-HYDROCHLOROTHIAZIDE 160-25 MG PO TABS: 0.5000 | ORAL_TABLET | Freq: Every day | ORAL | Status: DC

## 2015-02-16 MED ORDER — TAMSULOSIN HCL 0.4 MG PO CAPS
0.4000 mg | ORAL_CAPSULE | Freq: Every morning | ORAL | Status: DC
  Administered 2015-02-16 – 2015-02-19 (×4): 0.4 mg via ORAL
  Filled 2015-02-16 (×6): qty 1

## 2015-02-16 MED ORDER — HYDROCHLOROTHIAZIDE 25 MG PO TABS
25.0000 mg | ORAL_TABLET | Freq: Every day | ORAL | Status: DC
  Administered 2015-02-16 – 2015-02-19 (×4): 25 mg via ORAL
  Filled 2015-02-16 (×6): qty 1

## 2015-02-16 NOTE — Progress Notes (Addendum)
  [facility] Progress Note  Subjective  - POD #1  Some soreness in groins.   Objective Filed Vitals:   02/16/15 0753  BP:   Pulse:   Temp: 98 F (36.7 C)  Resp:     Intake/Output Summary (Last 24 hours) at 02/16/15 0952 Last data filed at 02/16/15 0600  Gross per 24 hour  Intake   2330 ml  Output   2025 ml  Net    305 ml   Groin cannulation sites without hematoma.   Assessment/Planning: 81 y.o. male is s/p: EVAR with fenestrated graft.  1 Day Post-Op   Creatinine elevated from 1.30 to 1.85 yesterday after contrast given. Will continue IVF at 75 cc/hr Renal duplex today to evaluate renal stents Mobilize Transfer to [place] 02/16/2015 9:52 AM --  Laboratory CBC    Component Value Date/Time   WBC 7.6 02/16/2015 0550   HGB 9.2* 02/16/2015 0550   HCT 26.7* 02/16/2015 0550   PLT 92* 02/16/2015 0550    BMET    Component Value Date/Time   NA 132* 02/16/2015 0550   K 4.2 02/16/2015 0550   CL 99* 02/16/2015 0550   CO2 26 02/16/2015 0550   GLUCOSE 129* 02/16/2015 0550   BUN 23* 02/16/2015 0550   CREATININE 1.85* 02/16/2015 0550   CALCIUM 8.2* 02/16/2015 0550   GFRNONAA 31* 02/16/2015 0550   GFRAA 36* 02/16/2015 0550    COAG Lab Results  Component Value Date   INR 1.34 02/15/2015   INR 1.04 02/09/2015   INR 1.0 12/12/2011   No results found for: PTT  Antibiotics Anti-infectives    Start     Dose/Rate Route Frequency Ordered Stop   02/15/15 2200  cefUROXime (ZINACEF) 1.5 g in dextrose 5 % 50 mL IVPB     1.5 g 100 mL/hr over 30 Minutes Intravenous Every 12 hours 02/15/15 1727 02/16/15 2159   02/15/15 1000  cefUROXime (ZINACEF) 1.5 g in dextrose 5 % 50 mL IVPB     1.5 g 100 mL/hr over 30 Minutes Intravenous To ShortStay Surgical 02/14/15 1507 02/15/15 Raymond, PA-C [facility] Office: [phone] Pager: [phone] 02/16/2015 9:52 AM     I agree with the above.   POD#1, s/p FEVAR with stent to left renal artery.  He is not complaining of any pain. Overall, he looks very good.  Both groin access sites are soft with good perfusion to both feet.  His abdomen is soft and non-tender  Acute Renal insufficiency, likely ATN from contrast load.   Accessory renal covered intentionally with stent graft.  The main renal artery on the left perfused >80% of the kidney.  I suspect this should improve with time.  He did have a significant left renal stenosis that was treated with the stent.   I will check a renal duplex today, and keep the patient an additional night to monitor his renal function.  IV fluids will be restarted for gentle hydration.  Wells Sopheap Basic  Addendum:  Renal duplex shows widely patent left renal artery

## 2015-02-16 NOTE — Progress Notes (Signed)
*  PRELIMINARY RESULTS* Vascular Ultrasound Renal Artery Duplex has been completed.  Preliminary findings: Technically difficult and limited due to overlying bowel gas.  Bilateral dampened waveforms noted. The right renal artery at origin demonstrates elevated velocities and turbulent flow, but velocity remains in normal range (159 cm/s). Compared to mid and distal velocities, this is significantly higher. Cannot rule out stenosis. No apparent elevated velocities noted in the left renal artery.     Landry Mellow, RDMS, RVT  02/16/2015, 10:30 AM

## 2015-02-16 NOTE — Progress Notes (Signed)
Utilization review completed. Dionne Rossa, RN, BSN. 

## 2015-02-16 NOTE — Clinical Documentation Improvement (Signed)
Vascular Surgery  Abnormal Lab/Test Results:   Component      BUN Creatinine  Latest Ref Rng      6 - 20 mg/dL 0.61 - 1.24 mg/dL  02/15/2015     3:25 PM 17 1.30 (H)  02/16/2015      23 (H) 1.85 (H)   Component      EGFR (Non-African Amer.)  Latest Ref Rng      >60 mL/min  02/15/2015     3:25 PM 48 (L)  02/16/2015      31 (L)    Possible Clinical Conditions associated with below indicators  Acute kidney injury / acute renal failure  Other Condition  Cannot Clinically Determine   Please exercise your independent, professional judgment when responding. A specific answer is not anticipated or expected.  Thank you, Mateo Flow, RN [phone] Clinical Documentation Specialist

## 2015-02-16 NOTE — Progress Notes (Signed)
Transfer report received from 3S at 1330 and pt arrived to the unit at 1450. VSS; telemetry applied and verified; pt oriented to the unit and room; bilateral groin incision level 0 with clean, dry dsg intact. No active, drainage, bleeding or hematoma noted. Pt sitting up in chair with call light within reach and spouse remains at bedside. Will closely monitor pt. Francis Gaines Landyn Lorincz RN.

## 2015-02-17 LAB — CBC
HCT: 27.5 % — ABNORMAL LOW (ref 39.0–52.0)
Hemoglobin: 9.6 g/dL — ABNORMAL LOW (ref 13.0–17.0)
MCH: 33.8 pg (ref 26.0–34.0)
MCHC: 34.9 g/dL (ref 30.0–36.0)
MCV: 96.8 fL (ref 78.0–100.0)
PLATELETS: 81 10*3/uL — AB (ref 150–400)
RBC: 2.84 MIL/uL — AB (ref 4.22–5.81)
RDW: 13 % (ref 11.5–15.5)
WBC: 7.8 10*3/uL (ref 4.0–10.5)

## 2015-02-17 LAB — BASIC METABOLIC PANEL
ANION GAP: 7 (ref 5–15)
BUN: 35 mg/dL — ABNORMAL HIGH (ref 6–20)
CO2: 26 mmol/L (ref 22–32)
Calcium: 8.2 mg/dL — ABNORMAL LOW (ref 8.9–10.3)
Chloride: 99 mmol/L — ABNORMAL LOW (ref 101–111)
Creatinine, Ser: 2.77 mg/dL — ABNORMAL HIGH (ref 0.61–1.24)
GFR, EST AFRICAN AMERICAN: 22 mL/min — AB (ref >60)
GFR, EST NON AFRICAN AMERICAN: 19 mL/min — AB (ref >60)
GLUCOSE: 104 mg/dL — AB (ref 65–99)
POTASSIUM: 4 mmol/L (ref 3.5–5.1)
Sodium: 132 mmol/L — ABNORMAL LOW (ref 135–145)

## 2015-02-17 NOTE — Progress Notes (Addendum)
  [facility] Progress Note  Subjective  - POD # 2  No complaints. Wife says patient has been a little weaker.   Objective Filed Vitals:   02/17/15 0508  BP: 142/61  Pulse: 65  Temp: 99.5 F (37.5 C)  Resp: 18    Intake/Output Summary (Last 24 hours) at 02/17/15 0753 Last data filed at 02/17/15 0200  Gross per 24 hour  Intake    512 ml  Output    675 ml  Net   -163 ml    Bilateral groins soft without hematoma. Feet warm and well perfused.   Assessment/Planning: 81 y.o. male is s/p: FEVAR 2 Days Post-Op   ARF: Creatinine rising to 2.77 today from 1.85 yesterday. Was 1.3 at admission. Increase IVF to 100 ml/hr. Encourage fluid intake. Renal duplex yesterday showing widely patent left renal artery.  Dispo: Await resolution of ARF  Alvia Grove 02/17/2015 7:53 AM --  Laboratory CBC    Component Value Date/Time   WBC 7.8 02/17/2015 0411   HGB 9.6* 02/17/2015 0411   HCT 27.5* 02/17/2015 0411   PLT 81* 02/17/2015 0411    BMET    Component Value Date/Time   NA 132* 02/17/2015 0411   K 4.0 02/17/2015 0411   CL 99* 02/17/2015 0411   CO2 26 02/17/2015 0411   GLUCOSE 104* 02/17/2015 0411   BUN 35* 02/17/2015 0411   CREATININE 2.77* 02/17/2015 0411   CALCIUM 8.2* 02/17/2015 0411   GFRNONAA 19* 02/17/2015 0411   GFRAA 22* 02/17/2015 0411    COAG Lab Results  Component Value Date   INR 1.34 02/15/2015   INR 1.04 02/09/2015   INR 1.0 12/12/2011   No results found for: PTT  Antibiotics Anti-infectives    Start     Dose/Rate Route Frequency Ordered Stop   02/15/15 2200  cefUROXime (ZINACEF) 1.5 g in dextrose 5 % 50 mL IVPB     1.5 g 100 mL/hr over 30 Minutes Intravenous Every 12 hours 02/15/15 1727 02/16/15 1056   02/15/15 1000  cefUROXime (ZINACEF) 1.5 g in dextrose 5 % 50 mL IVPB     1.5 g 100 mL/hr over 30 Minutes Intravenous To ShortStay Surgical 02/14/15 1507 02/15/15 [place], PA-C [facility] Office: [phone] Pager: [phone] 02/17/2015 7:53 AM  Agree with above. Acute renal failure likely related to contrast. Continue hydration. Not on any nephrotoxic meds.  It will likely level off and start to improve soon.  Deitra Mayo, MD, [place] [phone] Office: [phone]

## 2015-02-18 LAB — CBC
HEMATOCRIT: 29 % — AB (ref 39.0–52.0)
HEMOGLOBIN: 9.9 g/dL — AB (ref 13.0–17.0)
MCH: 33 pg (ref 26.0–34.0)
MCHC: 34.1 g/dL (ref 30.0–36.0)
MCV: 96.7 fL (ref 78.0–100.0)
Platelets: 39 10*3/uL — ABNORMAL LOW (ref 150–400)
RBC: 3 MIL/uL — AB (ref 4.22–5.81)
RDW: 12.9 % (ref 11.5–15.5)
WBC: 8 10*3/uL (ref 4.0–10.5)

## 2015-02-18 LAB — BASIC METABOLIC PANEL
ANION GAP: 9 (ref 5–15)
BUN: 32 mg/dL — ABNORMAL HIGH (ref 6–20)
CHLORIDE: 99 mmol/L — AB (ref 101–111)
CO2: 22 mmol/L (ref 22–32)
CREATININE: 2.7 mg/dL — AB (ref 0.61–1.24)
Calcium: 7.9 mg/dL — ABNORMAL LOW (ref 8.9–10.3)
GFR calc non Af Amer: 20 mL/min — ABNORMAL LOW (ref >60)
GFR, EST AFRICAN AMERICAN: 23 mL/min — AB (ref >60)
Glucose, Bld: 107 mg/dL — ABNORMAL HIGH (ref 65–99)
POTASSIUM: 3.8 mmol/L (ref 3.5–5.1)
SODIUM: 130 mmol/L — AB (ref 135–145)

## 2015-02-18 NOTE — Progress Notes (Signed)
   VASCULAR SURGERY ASSESSMENT & PLAN:  * 3 Days Post-Op s/p: FEVAR  *  Acute renal failure: 2.77 - > 2.7.  * Home tomorrow if crt continues to decrease.   SUBJECTIVE: Comfortable. No complaints.  PHYSICAL EXAM: Filed Vitals:   02/17/15 0508 02/17/15 1442 02/17/15 1942 02/18/15 0438  BP: 142/61 148/65 158/71 156/68  Pulse: 65 69 73 72  Temp: 99.5 F (37.5 C) 99 F (37.2 C) 100.1 F (37.8 C) 98.2 F (36.8 C)  TempSrc: Oral Oral Oral Oral  Resp: 18 16 18 18   Height:      Weight:      SpO2: 96% 97% 97% 98%   Lungs clear No LE swelling.   LABS: Lab Results  Component Value Date   WBC 8.0 02/18/2015   HGB 9.9* 02/18/2015   HCT 29.0* 02/18/2015   MCV 96.7 02/18/2015   PLT 39* 02/18/2015   Lab Results  Component Value Date   CREATININE 2.70* 02/18/2015   Lab Results  Component Value Date   INR 1.34 02/15/2015     Active Problems:   AAA (abdominal aortic aneurysm)   Edward Cannon Beeper: [id number] 02/18/2015

## 2015-02-18 NOTE — Progress Notes (Signed)
Nursing note Patient to chair x2 assist  Will monitor patient. Merwin Breden, Bettina Gavia RN

## 2015-02-18 NOTE — Progress Notes (Signed)
Patient ambulated in hallway with walker and 2 assist. Patient does shuffle his feet while walking some, and gait slightly unsteady. Patient ambulated approximately 200 feet. No complaints. Will monitor patient. Timiya Howells, Bettina Gavia RN

## 2015-02-18 NOTE — Progress Notes (Signed)
Lovenox Consult  Pt has been on lovenox for DVT prophylaxis. He plt has drop down to 39k. Ok to stop lovenox per Dr. Scot Dock.  Onnie Boer, PharmD Pager: [phone] 02/18/2015 1:05 PM

## 2015-02-19 LAB — CBC
HCT: 28.1 % — ABNORMAL LOW (ref 39.0–52.0)
Hemoglobin: 9.8 g/dL — ABNORMAL LOW (ref 13.0–17.0)
MCH: 33.4 pg (ref 26.0–34.0)
MCHC: 34.9 g/dL (ref 30.0–36.0)
MCV: 95.9 fL (ref 78.0–100.0)
PLATELETS: 99 10*3/uL — AB (ref 150–400)
RBC: 2.93 MIL/uL — ABNORMAL LOW (ref 4.22–5.81)
RDW: 12.5 % (ref 11.5–15.5)
WBC: 8.1 10*3/uL (ref 4.0–10.5)

## 2015-02-19 LAB — CREATININE, SERUM
CREATININE: 2.39 mg/dL — AB (ref 0.61–1.24)
GFR, EST AFRICAN AMERICAN: 27 mL/min — AB (ref >60)
GFR, EST NON AFRICAN AMERICAN: 23 mL/min — AB (ref >60)

## 2015-02-19 MED ORDER — HYDROCODONE-ACETAMINOPHEN 5-300 MG PO TABS: 5.0000 mg | ORAL_TABLET | Freq: Four times a day (QID) | ORAL | Status: DC | PRN

## 2015-02-19 NOTE — Progress Notes (Addendum)
  [facility] Progress Note  Subjective  - POD #4  Had some neck soreness last night. Otherwise, no complaints.   Objective Filed Vitals:   02/19/15 0501  BP: 158/64  Pulse: 67  Temp: 97.9 F (36.6 C)  Resp: 20    Intake/Output Summary (Last 24 hours) at 02/19/15 0752 Last data filed at 02/19/15 0740  Gross per 24 hour  Intake 859.58 ml  Output   3100 ml  Net -2240.42 ml    Feet warm and well perfused. Limited lateral rotation of neck  Assessment/Planning: 81 y.o. male is s/p: FEVAR 4 Days Post-Op   -Acute renal failure secondary to contrast: continuing to improve. Creatinine down to 2.33 today and UOP increasing.  -Thrombocytopenia: plts 39 yesterday. Lovenox discontinued. Recheck today.  -Neck pain: likely positional. Tylenol and warm compresses for neck pain.  -Discharge home today if platelets are fine. Follow up in 4 weeks with CTA.   Edward Cannon 02/19/2015 7:52 AM --  Laboratory CBC    Component Value Date/Time   WBC 8.0 02/18/2015 0501   HGB 9.9* 02/18/2015 0501   HCT 29.0* 02/18/2015 0501   PLT 39* 02/18/2015 0501    BMET    Component Value Date/Time   NA 130* 02/18/2015 0501   K 3.8 02/18/2015 0501   CL 99* 02/18/2015 0501   CO2 22 02/18/2015 0501   GLUCOSE 107* 02/18/2015 0501   BUN 32* 02/18/2015 0501   CREATININE 2.39* 02/19/2015 0429   CALCIUM 7.9* 02/18/2015 0501   GFRNONAA 23* 02/19/2015 0429   GFRAA 27* 02/19/2015 0429    COAG Lab Results  Component Value Date   INR 1.34 02/15/2015   INR 1.04 02/09/2015   INR 1.0 12/12/2011   No results found for: PTT  Antibiotics Anti-infectives    Start     Dose/Rate Route Frequency Ordered Stop   02/15/15 2200  cefUROXime (ZINACEF) 1.5 g in dextrose 5 % 50 mL IVPB     1.5 g 100 mL/hr over 30 Minutes Intravenous Every 12 hours 02/15/15 1727 02/16/15 1056   02/15/15 1000  cefUROXime (ZINACEF) 1.5 g in dextrose 5 % 50 mL IVPB     1.5 g 100 mL/hr over 30 Minutes  Intravenous To ShortStay Surgical 02/14/15 1507 02/15/15 Edward Doon, PA-C [facility] Office: [phone] Pager: [phone] 02/19/2015 7:52 AM  Agree with above. CRT better. Thrombocytopenia improved. Home today.  Edward Mayo, MD, [place] [phone] Office: [phone]

## 2015-02-19 NOTE — Progress Notes (Signed)
Paged Virgina Jock PAC/ Dr. Scot Dock about patients Platelet lab work resulted in computer. Results given/ Dr. Scot Dock stated OK to be discharged home. Kayton Dunaj, Bettina Gavia RN

## 2015-02-19 NOTE — Progress Notes (Signed)
Nursing note Pt given discharge instructions, medication list and paper prescriptions. Along with follow up appointments. Patient verbalized understanding will discharge home as ordered. Byran Bilotti, Bettina Gavia RN

## 2015-02-19 NOTE — Care Management Important Message (Signed)
Important Message  Patient Details  Name: Edward Cannon MRN: [id number] Date of Birth: 15-Mar-1926   Medicare Important Message Given:  Yes-second notification given    Loann Quill 02/19/2015, 8:49 AM

## 2015-02-20 NOTE — Anesthesia Postprocedure Evaluation (Signed)
  Anesthesia Post-op Note  Patient: Edward Cannon  Procedure(s) Performed: Procedure(s): ABDOMINAL AORTIC ENDOVASCULAR FENESTRATED STENT GRAFT (N/A)  Patient Location: PACU  Anesthesia Type:General  Level of Consciousness: awake  Airway and Oxygen Therapy: Patient Spontanous Breathing  Post-op Pain: none  Post-op Assessment: Post-op Vital signs reviewed, Patient's Cardiovascular Status Stable, Respiratory Function Stable, Patent Airway, No signs of Nausea or vomiting and Pain level controlled              Post-op Vital Signs: Reviewed and stable  Last Vitals:  Filed Vitals:   02/19/15 0929  BP: 162/71  Pulse:   Temp:   Resp:     Complications: No apparent anesthesia complications

## 2015-02-21 ENCOUNTER — Telehealth: Payer: Self-pay

## 2015-02-21 ENCOUNTER — Telehealth: Payer: Self-pay | Admitting: Interventional Cardiology

## 2015-02-21 NOTE — Telephone Encounter (Signed)
New Message    Pt is calling because he is having neck pain  And it makes him scream

## 2015-02-21 NOTE — Telephone Encounter (Signed)
Returned pt call. Pt sts that he is having severe neck pain. Pt denies chest pian, sob, shoulder pain, swelling, fever, chill,cough. Pt sts that the pain does not radiate. Pt sts that he could not get out of bed today because of the pain. Adv pt that if pain is that sever he should seek emergency care. Pt does not want to got to the ED Adv pt that since he has no cardiac complaints, he should contact his pcp office for f/u Pt voice appreciation for the call back and verbalized understanding.

## 2015-02-21 NOTE — Telephone Encounter (Signed)
Phone call from pt.  Reported he woke up this morning with very intense left shoulder pain upon certain movement.  Stated "I can't get off the bed, due to the pain."  Explained that he can move around to a certain extent in bed, and then has the onset of this terrible pain in his shoulder, and has to stop moving, until it lets-up.  Denied any localized inflammation or swelling of the left shoulder.  Denied SOB. Requested that pain medication be called to the pharmacy.  Wife in background, and stating that the pt. started having a "crick in his neck, when he came home from the hospital."  Advised will discuss with MD in office.  Discussed with Dr. Oneida Alar.  Advised pt. should call his PCP, as it is not clear why he is having shoulder pain, following his recent procedure.  Returned call to pt.  Advised of Dr. Oneida Alar' recommendation.  Stated "I can't get up, I don't know how I will get to Dr. Inda Merlin."  Advised he may need to call EMS, if the pain is so bad that he can't get out of bed.  Pt. Stated he will call his PCP.

## 2015-02-23 ENCOUNTER — Telehealth: Payer: Self-pay | Admitting: Surgery

## 2015-02-23 NOTE — Telephone Encounter (Signed)
-----   Message from Mena Goes, RN sent at 02/21/2015 11:56 AM EDT ----- Regarding: RE: Schedule Please send at message to [place], pt's creatinine is trending down and he had a FEVAR. He has cardiac issues also.   ----- Message -----    From: Gena Fray    Sent: 02/21/2015  10:38 AM      To: Mena Goes, RN Subject: FW: Schedule                                   Kay-  This guy has elevated creatinine. Would we just need to do without?  Hinton Dyer ----- Message -----    From: Mena Goes, RN    Sent: 02/20/2015  11:54 AM      To: [facility] Admin Pool Subject: Schedule                                         ----- Message -----    From: Dario Ave    Sent: 02/20/2015  11:16 AM      To: [facility] Charge Pool Subject: Kay's log                                        ----- Message -----    From: Alvia Grove, PA-C    Sent: 02/19/2015   7:58 AM      To: [facility] Charge Pool  S/p FEVAR 02/15/15  F/u in 4 weeks with Dr. Trula Slade and CTA  Thanks Maudie Mercury

## 2015-02-23 NOTE — Telephone Encounter (Signed)
I have sent a message to [facility] for input- waiting to hear back

## 2015-02-26 ENCOUNTER — Other Ambulatory Visit: Payer: Self-pay | Admitting: *Deleted

## 2015-02-26 ENCOUNTER — Telehealth: Payer: Self-pay | Admitting: Surgery

## 2015-02-26 DIAGNOSIS — I714 Abdominal aortic aneurysm, without rupture, unspecified: Secondary | ICD-10-CM

## 2015-02-26 DIAGNOSIS — Z48812 Encounter for surgical aftercare following surgery on the circulatory system: Secondary | ICD-10-CM

## 2015-02-26 NOTE — Telephone Encounter (Signed)
-----   Message from Serafina Mitchell, MD sent at 02/24/2015 11:43 AM EDT ----- Regarding: RE: Schedule Make it a non-contrast CT with 0.70mm cuts ----- Message -----    From: Gena Fray    Sent: 02/23/2015  11:44 AM      To: Serafina Mitchell, MD Subject: FW: Schedule                                   Dr Trula Slade-  Please advise.   This gentleman needs CTA for his follow up of FEVAR, but his creatinine is elevated. Do you still want to order the CT with contrast?  Thanks, Hinton Dyer ----- Message -----    From: Mena Goes, RN    Sent: 02/21/2015  11:56 AM      To: Gena Fray Subject: RE: Schedule                                   Please send at message to [place], pt's creatinine is trending down and he had a FEVAR. He has cardiac issues also.   ----- Message -----    From: Gena Fray    Sent: 02/21/2015  10:38 AM      To: Mena Goes, RN Subject: FW: Schedule                                   Kay-  This guy has elevated creatinine. Would we just need to do without?  Hinton Dyer ----- Message -----    From: Mena Goes, RN    Sent: 02/20/2015  11:54 AM      To: [facility] Admin Pool Subject: Schedule                                         ----- Message -----    From: Dario Ave    Sent: 02/20/2015  11:16 AM      To: [facility] Charge Pool Subject: Kay's log                                        ----- Message -----    From: Alvia Grove, PA-C    Sent: 02/19/2015   7:58 AM      To: [facility] Charge Pool  S/p FEVAR 02/15/15  F/u in 4 weeks with Dr. Trula Slade and CTA  Thanks Maudie Mercury

## 2015-02-26 NOTE — Telephone Encounter (Signed)
Waiting on order

## 2015-02-27 ENCOUNTER — Telehealth: Payer: Self-pay | Admitting: Surgery

## 2015-02-27 NOTE — Telephone Encounter (Signed)
----- Message from Mena Goes, RN sent at 02/26/2015 12:37 PM EDT ----- Regarding: RE: Schedule Done :)  ----- Message -----    From: Gena Fray    Sent: 02/26/2015  11:33 AM      To: Mena Goes, RN Subject: FW: Schedule                                   Zigmund Daniel- can you please enter the order?  Thanks, Hinton Dyer ----- Message -----    From: Serafina Mitchell, MD    Sent: 02/24/2015  11:43 AM      To: Gena Fray Subject: RE: Schedule                                   Make it a non-contrast CT with 0.5mm cuts ----- Message -----    From: Gena Fray    Sent: 02/23/2015  11:44 AM      To: Serafina Mitchell, MD Subject: FW: Schedule                                   Dr Trula Slade-  Please advise.   This gentleman needs CTA for his follow up of FEVAR, but his creatinine is elevated. Do you still want to order the CT with contrast?  Thanks, Hinton Dyer ----- Message -----    From: Mena Goes, RN    Sent: 02/21/2015  11:56 AM      To: Gena Fray Subject: RE: Schedule                                   Please send at message to [place], pt's creatinine is trending down and he had a FEVAR. He has cardiac issues also.   ----- Message -----    From: Gena Fray    Sent: 02/21/2015  10:38 AM      To: Mena Goes, RN Subject: FW: Schedule                                   Kay-  This guy has elevated creatinine. Would we just need to do without?  Hinton Dyer ----- Message -----    From: Mena Goes, RN    Sent: 02/20/2015  11:54 AM      To: [facility] Admin Pool Subject: Schedule                                         ----- Message -----    From: Dario Ave    Sent: 02/20/2015  11:16 AM      To: [facility] Charge Pool Subject: Kay's log                                        ----- Message -----    From: Alvia Grove, PA-C    Sent: 02/19/2015   7:58 AM  To: [facility] Charge Pool  S/p FEVAR 02/15/15  F/u in 4 weeks with Dr. Trula Slade and  CTA  Thanks Maudie Mercury

## 2015-02-27 NOTE — Telephone Encounter (Signed)
Spoke with pt to schedule, mailed CT paperwork , dpm

## 2015-03-15 ENCOUNTER — Encounter: Payer: Self-pay | Admitting: Surgery

## 2015-03-19 ENCOUNTER — Ambulatory Visit
Admission: RE | Admit: 2015-03-19 | Discharge: 2015-03-19 | Disposition: A | Discharge status: Home / Self Care | Payer: Medicare Other | Source: Ambulatory Visit | Attending: Surgery | Admitting: Surgery

## 2015-03-19 ENCOUNTER — Encounter: Payer: Self-pay | Admitting: Surgery

## 2015-03-19 ENCOUNTER — Ambulatory Visit (INDEPENDENT_AMBULATORY_CARE_PROVIDER_SITE_OTHER): Payer: Medicare Other | Admitting: Surgery

## 2015-03-19 VITALS — BP 173/86 | HR 65 | Temp 97.5°F | Resp 16 | Ht 65.5 in | Wt 145.0 lb

## 2015-03-19 DIAGNOSIS — I714 Abdominal aortic aneurysm, without rupture, unspecified: Secondary | ICD-10-CM

## 2015-03-19 DIAGNOSIS — Z48812 Encounter for surgical aftercare following surgery on the circulatory system: Secondary | ICD-10-CM

## 2015-03-19 NOTE — Progress Notes (Signed)
Patient name: Edward Cannon MRN: [id number] DOB: [birth year] Sex: male     Chief Complaint  Patient presents with  . Routine Post Op    S/P  4 wk EVAR 02-15-15  f/u     Pt. had CTA  Abd. /Pel   today.    HISTORY OF PRESENT ILLNESS: The patient is back today for follow-up.  On 02/18/2015 he underwent a fenestrated endovascular repair of a juxtarenal abdominal aortic aneurysm including stenting of his left renal artery.  This was done using a ZFEN device.  The patient's only postoperative issue has been an increase in his renal function above his baseline.  At the time of the operation he had a hemodynamically significant left renal artery stenosis which was stented.  He reports no complaints today.  He did have some neck pain after surgery but that has nearly resolved.  Past Medical History  Diagnosis Date  . Hypertension   . Hyperlipidemia   . Microscopic hematuria   . Coronary artery disease S/P CABG   . Atrial fibrillation ([place])   . Bouchard nodes (DJD hand)   . Hx of colonic polyps   . Ventral hernia     Asymptomatic  . Diverticulosis     Wtih Anatomotic friability on colonscopy  . Urinary urgency     With Incontinence  . BPH (benign prostatic hypertrophy)   . 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 Syncope     Recurrent, second to Bradycardia  . Sinus node dysfunction   . Anginal pain ([place]) 2005  . Pacemaker   . History of bronchitis winter 2012  . H/O scarlet fever 1936  . Leg weakness, bilateral 10/09/2014  . Abnormality of gait 10/09/2014  . AAA (abdominal aortic aneurysm) ([place]) 10/04/14    Per  Dr. Viviana Simpler    Past Surgical History  Procedure Laterality Date  . [place]      Obtuse marginal branch of the left circumflex   . Rima      to the LAD  . Svg  2005    to posterior lateral branch of the RCA  . Low anterior bowel resection      for large villous  . Colon resection  ~ 2005  . Coronary artery bypass graft  ~ 2009    CABG X3  . Mastoidectomy  1930  . Cardiac catheterization    . Colon surgery    . Insert / replace / remove pacemaker  12/22/11  . Permanent pacemaker insertion N/A 12/22/2011    Procedure: PERMANENT PACEMAKER INSERTION;  Surgeon: Deboraha Sprang, MD;  Location: [facility] CATH LAB;  Service: Cardiovascular;  Laterality: N/A;  . Cataract extraction Bilateral   . Eye surgery Bilateral 2012  . Renal artery stent Left 02/15/2015    Stenting and angioplasty of left renal artery  . Abdominal aortic endovascular fenestrated stent graft N/A 02/15/2015    Procedure: ABDOMINAL AORTIC ENDOVASCULAR FENESTRATED STENT GRAFT;  Surgeon: Serafina Mitchell, MD;  Location: [facility] OR;  Service: Vascular;  Laterality: N/A;    Social History   Social History  . Marital Status: Married    Spouse Name: N/A  . Number of Children: 1  . Years of Education: PhD   Occupational History  . retired    Social History Main Topics  . Smoking status: Never Smoker   . Smokeless tobacco: Never Used  . Alcohol Use: 0.6 oz/week    1  Glasses of wine per week  . Drug Use: No  . Sexual Activity: No   Other Topics Concern  . Not on file   Social History Narrative   Patient is right handed.   Patient does not drink caffeine.    Family History  Problem Relation Age of Onset  . Diabetes Mother   . Heart disease Mother     Pacemaker  . CAD Mother   . COPD Father   . Prostate cancer Father   . Pancreatic cancer Brother   . Other Brother     CABG  . Heart failure Brother   . Coronary artery disease Brother     Allergies as of 03/19/2015 - Review Complete 03/19/2015  Allergen Reaction Noted  . Crestor [rosuvastatin] Other (See Comments) 12/11/2011  . Hygroton [chlorthalidone] Other (See Comments) 12/11/2011  . Lipitor [atorvastatin] Other (See Comments) 12/11/2011  . Metamucil [psyllium] Other (See Comments) 12/11/2011  . Mevacor [lovastatin] Other (See Comments) 12/11/2011  . Pravastatin Other (See Comments) 12/11/2011  .  Vytorin [ezetimibe-simvastatin] Other (See Comments) 12/11/2011  . Zetia [ezetimibe] Other (See Comments) 12/11/2011  . Toviaz [fesoterodine fumarate er]  10/06/2014    Current Outpatient Prescriptions on File Prior to Visit  Medication Sig Dispense Refill  . amLODipine (NORVASC) 2.5 MG tablet Take 2.5 mg by mouth daily.     . Aspirin 81 MG EC tablet Take 81 mg by mouth every evening.     . cholecalciferol (VITAMIN D) 1000 UNITS tablet Take 1,000 Units by mouth daily. D3    . cholestyramine (QUESTRAN) 4 G packet Take 1 packet by mouth 2 (two) times daily. Take in 4 oz orange juice followed by 8 oz water    . Multiple Vitamin (MULTIVITAMIN) tablet Take 1 tablet by mouth daily. Without iron    . nitroGLYCERIN (NITROSTAT) 0.4 MG SL tablet Place 0.4 mg under the tongue every 5 (five) minutes x 3 doses as needed. For chest pain    . Tamsulosin HCl (FLOMAX) 0.4 MG CAPS Take 0.4 mg by mouth every morning.     . valsartan-hydrochlorothiazide (DIOVAN-HCT) 160-25 MG per tablet Take 0.5 tablets by mouth daily.     . vitamin E (VITAMIN E) 400 UNIT capsule Take 400 Units by mouth every morning.     . vitamin k 100 MCG tablet Take 50 mcg by mouth once a week. Mondays    . Hydrocodone-Acetaminophen (VICODIN) 5-300 MG TABS Take 5 mg by mouth every 6 (six) hours as needed. (Patient not taking: Reported on 03/19/2015) 15 each 0   No current facility-administered medications on file prior to visit.      PHYSICAL EXAMINATION:   Vital signs are  Filed Vitals:   03/19/15 0943 03/19/15 0948  BP: 181/85 173/86  Pulse: 65 65  Temp: 97.5 F (36.4 C)   TempSrc: Oral   Resp: 16   Height: 5' 5.5" (1.664 m)   Weight: 145 lb (65.772 kg)   SpO2: 99%    Body mass index is 23.75 kg/(m^2). General: The patient appears their stated age. HEENT:  No gross abnormalities Pulmonary:  Non labored breathing Abdomen: Soft and non-tender Musculoskeletal: There are no major deformities. Neurologic: No focal weakness or  paresthesias are detected, Skin: There are no ulcer or rashes noted. Psychiatric: The patient has normal affect. Cardiovascular: Palpable pedal pulses.  Both groin cannulation sites are without complication   Diagnostic Studies I have reviewed his CT scan which was a noncontrasted scan.  This shows a  stable 5.9 cm infrarenal abdominal aortic aneurysm.  Assessment: Status post fenestrated endovascular aneurysm repair Plan: The patient is doing very well.  His renal dysfunction has stabilized.  He has had no further complications from his operation and is doing very well.  I will have him follow-up in 6 months with an ultrasound  V. Leia Alf, M.D. [facility] Office: [phone] Pager:  [phone]

## 2015-03-19 NOTE — Addendum Note (Signed)
Addended by: Dorthula Rue L on: 03/19/2015 02:48 PM   Modules accepted: Orders

## 2015-03-19 NOTE — Progress Notes (Signed)
Filed Vitals:   03/19/15 0943 03/19/15 0948  BP: 181/85 173/86  Pulse: 65 65  Temp: 97.5 F (36.4 C)   TempSrc: Oral   Resp: 16   Height: 5' 5.5" (1.664 m)   Weight: 145 lb (65.772 kg)   SpO2: 99%

## 2015-03-22 ENCOUNTER — Encounter: Payer: Medicare Other | Admitting: Internal Medicine

## 2015-03-22 NOTE — Discharge Summary (Signed)
EVAR Discharge Summary   Edward Cannon 06-03-1926 81 y.o. male  MRN: [id number]  Admission Date: 02/15/2015  Discharge Date: 02/19/15  Physician: No att. providers found  Admission Diagnosis: Juxtarenal abdominal aortic aneurysm I71.4   HPI:   This is a 81 y.o. male patient comes in today with additional questions regarding repair of his juxtarenal 5.9 cm abdominal aortic aneurysm. He denies any abdominal pain or back pain.  The patient suffers hyperlipidemia. He has a statin allergy. He is on an ARB for hypertension. He is a nonsmoker. He has a pacemaker for sick sinus syndrome. He is status post CABG  Hospital Course:  The patient was admitted to the hospital and taken to the operating room on 02/15/2015 and underwent:  #1: Fenestrated endovascular repair of juxtarenal abdominal aortic aneurysm #2: Stent, left renal artery #3: Bilateral common femoral ultrasound-guided percutaneous access #4: Abdominal aortogram #5: Left Renal angiogram #6: Catheter in aorta 2    The pt tolerated the procedure well and was transported to the PACU in good condition.   He did have palpable pedal pulses in the recovery room.   On POD 1, he did have an elevation of his creatinine with ARF likely ATN from contrast load from 1.30 to 1.85 after receiving contrast media (78.42cc contrast given).  He did have a significant left RAS that was treated with a stent.  A renal duplex was obtained with the following results:  Bilateral dampened waveforms noted. The right renal artery at origin demonstrates elevated velocities and turbulent flow, but velocity remains in normal range (159 cm/s). Compared to mid and distal velocities, this is significantly higher. Cannot rule out stenosis. No apparent elevated velocities noted in the left renal artery.    He was kept on IVF for gentle hydration.  On POD 2, his creatinine did jump to 2.77.  By POD 4, his creatinine was decreased to 2.33 and UOP  increasing.  On POD 4, he did have a thrombocytopenia of 39k. This was rechecked the next day and it was 99k.  He was discharged home.  The remainder of the hospital course consisted of increasing mobilization and increasing intake of solids without difficulty.  CBC    Component Value Date/Time   WBC 8.1 02/19/2015 0856   RBC 2.93* 02/19/2015 0856   HGB 9.8* 02/19/2015 0856   HCT 28.1* 02/19/2015 0856   PLT 99* 02/19/2015 0856   MCV 95.9 02/19/2015 0856   MCH 33.4 02/19/2015 0856   MCHC 34.9 02/19/2015 0856   RDW 12.5 02/19/2015 0856   LYMPHSABS 1.2 12/12/2011 1118   MONOABS 0.6 12/12/2011 1118   EOSABS 0.2 12/12/2011 1118   BASOSABS 0.0 12/12/2011 1118    BMET    Component Value Date/Time   NA 130* 02/18/2015 0501   K 3.8 02/18/2015 0501   CL 99* 02/18/2015 0501   CO2 22 02/18/2015 0501   GLUCOSE 107* 02/18/2015 0501   BUN 32* 02/18/2015 0501   CREATININE 2.39* 02/19/2015 0429   CALCIUM 7.9* 02/18/2015 0501   GFRNONAA 23* 02/19/2015 0429   GFRAA 27* 02/19/2015 0429       Discharge Instructions    Call MD for:  redness, tenderness, or signs of infection (pain, swelling, bleeding, redness, odor or green/yellow discharge around incision site)    Complete by:  As directed      Call MD for:  severe or increased pain, loss or decreased feeling  in affected limb(s)    Complete by:  As directed  Call MD for:  temperature >100.5    Complete by:  As directed      Discharge wound care:    Complete by:  As directed   Wash groin wounds daily with soap and water and pat dry. You do not have to apply a dressing on top.     Driving Restrictions    Complete by:  As directed   No driving for 2 weeks     Increase activity slowly    Complete by:  As directed   Walk with assistance use walker or cane as needed     Lifting restrictions    Complete by:  As directed   No lifting for 4 weeks     Resume previous diet    Complete by:  As directed            Discharge  Diagnosis:  Juxtarenal abdominal aortic aneurysm I71.4  Secondary Diagnosis: Patient Active Problem List   Diagnosis Date Noted  . AAA (abdominal aortic aneurysm) ([place]) 02/15/2015  . Pain in joint, lower leg 10/24/2014  . Abdominal aortic aneurysm without rupture ([place]) 10/20/2014  . Leg weakness, bilateral 10/09/2014  . Abnormality of gait 10/09/2014  . Pacemaker-St.Jude 12/22/2011  . Sinus node dysfunction ([place]) 12/13/2011  . Atrial fibrillation ([place])   . Coronary artery disease S/P CABG   . Syncope    Past Medical History  Diagnosis Date  . Hypertension   . Hyperlipidemia   . Microscopic hematuria   . Coronary artery disease S/P CABG   . Atrial fibrillation ([place])   . Bouchard nodes (DJD hand)   . Hx of colonic polyps   . Ventral hernia     Asymptomatic  . Diverticulosis     Wtih Anatomotic friability on colonscopy  . Urinary urgency     With Incontinence  . BPH (benign prostatic hypertrophy)   . 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 Syncope     Recurrent, second to Bradycardia  . Sinus node dysfunction   . Anginal pain ([place]) 2005  . Pacemaker   . History of bronchitis winter 2012  . H/O scarlet fever 1936  . Leg weakness, bilateral 10/09/2014  . Abnormality of gait 10/09/2014  . AAA (abdominal aortic aneurysm) ([place]) 10/04/14    Per  Dr. Viviana Simpler       Medication List    TAKE these medications        amLODipine 2.5 MG tablet  Commonly known as:  NORVASC  Take 2.5 mg by mouth daily.     Aspirin 81 MG EC tablet  Take 81 mg by mouth every evening.     cholecalciferol 1000 UNITS tablet  Commonly known as:  VITAMIN D  Take 1,000 Units by mouth daily. D3     cholestyramine 4 G packet  Commonly known as:  QUESTRAN  Take 1 packet by mouth 2 (two) times daily. Take in 4 oz orange juice followed by 8 oz water     Hydrocodone-Acetaminophen 5-300 MG Tabs  Commonly known as:  VICODIN  Take 5 mg by mouth every 6 (six) hours  as needed.     multivitamin tablet  Take 1 tablet by mouth daily. Without iron     nitroGLYCERIN 0.4 MG SL tablet  Commonly known as:  NITROSTAT  Place 0.4 mg under the tongue every 5 (five) minutes x 3 doses as needed. For chest pain     tamsulosin 0.4 MG Caps  capsule  Commonly known as:  FLOMAX  Take 0.4 mg by mouth every morning.     valsartan-hydrochlorothiazide 160-25 MG tablet  Commonly known as:  DIOVAN-HCT  Take 0.5 tablets by mouth daily.     vitamin E 400 UNIT capsule  Generic drug:  vitamin E  Take 400 Units by mouth every morning.     vitamin k 100 MCG tablet  Take 50 mcg by mouth once a week. Mondays        Prescriptions given: Vicodin  Disposition: home  Patient's condition: is Good  Follow up: 1. Dr. Trula Slade in 4 weeks with CTA   Leontine Locket, PA-C [facility] [phone] 03/22/2015  9:24 AM   - For VQI Registry use --- Instructions: Press F2 to tab through selections.  Delete question if not applicable.   Post-op:  Time to Extubation: [x]  In OR, [ ]  < 12 hrs, [ ]  12-24 hrs, [ ]  >=24 hrs Vasopressors Req. Post-op: No MI: No., [ ]  Troponin only, [ ]  EKG or Clinical New Arrhythmia: No CHF: No ICU Stay: 1 day in ICU Transfusion: No  If yes, n/a units given  Complications: Resp failure: No., [ ]  Pneumonia, [ ]  Ventilator Chg in renal function: Yes.  , [x ] Inc. Cr > 0.5, [ ]  Temp. Dialysis, [ ]  Permanent dialysis Leg ischemia: No., no Surgery needed, [ ]  Yes, Surgery needed, [ ]  Amputation Bowel ischemia: No., [ ]  Medical Rx, [ ]  Surgical Rx Wound complication: No., [ ]  Superficial separation/infection, [ ]  Return to OR Return to OR: No  Return to OR for bleeding: No Stroke: No., [ ]  Minor, [ ]  Major  Discharge medications: Statin use:  No If No: [x ] For Medical reasons, [ ]  Non-compliant, [ ]  Not-indicated ASA use:  Yes  If No: [ ]  For Medical reasons, [ ]  Non-compliant, [ ]  Not-indicated Plavix use:  No If No: [ ]   For Medical reasons, [ ]  Non-compliant, [ ]  Not-indicated Beta blocker use:  No If No: [ ]  For Medical reasons, [ ]  Non-compliant, [ ]  Not-indicated

## 2015-03-28 ENCOUNTER — Encounter: Payer: Self-pay | Admitting: *Deleted

## 2015-03-29 ENCOUNTER — Encounter: Payer: Self-pay | Admitting: Internal Medicine

## 2015-03-29 ENCOUNTER — Ambulatory Visit (INDEPENDENT_AMBULATORY_CARE_PROVIDER_SITE_OTHER): Payer: Medicare Other | Admitting: Internal Medicine

## 2015-03-29 VITALS — BP 158/90 | HR 71 | Ht 65.0 in | Wt 152.6 lb

## 2015-03-29 DIAGNOSIS — I495 Sick sinus syndrome: Secondary | ICD-10-CM | POA: Diagnosis not present

## 2015-03-29 DIAGNOSIS — I25810 Atherosclerosis of coronary artery bypass graft(s) without angina pectoris: Secondary | ICD-10-CM | POA: Diagnosis not present

## 2015-03-29 DIAGNOSIS — I480 Paroxysmal atrial fibrillation: Secondary | ICD-10-CM

## 2015-03-29 DIAGNOSIS — Z950 Presence of cardiac pacemaker: Secondary | ICD-10-CM

## 2015-03-29 LAB — CUP PACEART INCLINIC DEVICE CHECK
Battery Voltage: 2.95 V
Brady Statistic RA Percent Paced: 96 %
Date Time Interrogation Session: 20161013110825
Implantable Lead Implant Date: 20130708
Implantable Lead Implant Date: 20130708
Implantable Lead Location: 753860
Implantable Lead Model: 1948
Lead Channel Impedance Value: 412.5 Ohm
Lead Channel Impedance Value: 450 Ohm
Lead Channel Pacing Threshold Amplitude: 0.75 V
Lead Channel Pacing Threshold Amplitude: 0.75 V
Lead Channel Pacing Threshold Amplitude: 2 V
Lead Channel Pacing Threshold Pulse Width: 0.4 ms
Lead Channel Pacing Threshold Pulse Width: 1 ms
Lead Channel Sensing Intrinsic Amplitude: 4.1 mV
Lead Channel Setting Pacing Amplitude: 2 V
Lead Channel Setting Pacing Amplitude: 3.5 V
Lead Channel Setting Pacing Pulse Width: 0.6 ms
MDC IDC LEAD LOCATION: 753859
MDC IDC MSMT BATTERY REMAINING LONGEVITY: 117.6
MDC IDC MSMT LEADCHNL RA PACING THRESHOLD PULSEWIDTH: 0.4 ms
MDC IDC MSMT LEADCHNL RV PACING THRESHOLD AMPLITUDE: 2 V
MDC IDC MSMT LEADCHNL RV PACING THRESHOLD PULSEWIDTH: 1 ms
MDC IDC MSMT LEADCHNL RV SENSING INTR AMPL: 12 mV
MDC IDC PG SERIAL: 7350984
MDC IDC SET LEADCHNL RV SENSING SENSITIVITY: 2 mV
MDC IDC STAT BRADY RV PERCENT PACED: 0.15 %

## 2015-03-29 NOTE — Progress Notes (Signed)
Patient Care Team: Josetta Huddle, MD as PCP - General (Internal Medicine)   HPI  Edward Cannon is a 81 y.o. male Seen in followup for a pacemaker implanted 7/13 for sinus node dysfunction in the setting of ischemic heart disease with prior bypass surgery and normal left ventricular function. He also has a history of atrial fibrillation.   He was having syncope. Event recorder demonstrated a symptomatic pauses of greater than 3 seconds. He also has symptomatic bradycardia which prompted the implantation.    : He had syncope  2 years ago and none since.  He denies problems with breathing chest pain or peripheral edema. There have been no palpitations.  Past Medical History  Diagnosis Date  . Hypertension   . Hyperlipidemia   . Microscopic hematuria   . Coronary artery disease S/P CABG   . Atrial fibrillation ([place])   . Bouchard nodes (DJD hand)   . Hx of colonic polyps   . Ventral hernia     Asymptomatic  . Diverticulosis     Wtih Anatomotic friability on colonscopy  . Urinary urgency     With Incontinence  . BPH (benign prostatic hypertrophy)   . 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 Syncope     Recurrent, second to Bradycardia  . Sinus node dysfunction   . Anginal pain ([place]) 2005  . Pacemaker   . History of bronchitis winter 2012  . H/O scarlet fever 1936  . Leg weakness, bilateral 10/09/2014  . Abnormality of gait 10/09/2014  . AAA (abdominal aortic aneurysm) ([place]) 10/04/14    Per  Dr. Viviana Simpler    Past Surgical History  Procedure Laterality Date  . [place]      Obtuse marginal branch of the left circumflex   . Rima      to the LAD  . Svg  2005    to posterior lateral branch of the RCA  . Low anterior bowel resection      for large villous  . Colon resection  ~ 2005  . Coronary artery bypass graft  ~ 2009    CABG X3  . Mastoidectomy  1930  . Cardiac catheterization    . Colon surgery    . Insert / replace /  remove pacemaker  12/22/11  . Permanent pacemaker insertion N/A 12/22/2011    Procedure: PERMANENT PACEMAKER INSERTION;  Surgeon: Deboraha Sprang, MD;  Location: [facility] CATH LAB;  Service: Cardiovascular;  Laterality: N/A;  . Cataract extraction Bilateral   . Eye surgery Bilateral 2012  . Renal artery stent Left 02/15/2015    Stenting and angioplasty of left renal artery  . Abdominal aortic endovascular fenestrated stent graft N/A 02/15/2015    Procedure: ABDOMINAL AORTIC ENDOVASCULAR FENESTRATED STENT GRAFT;  Surgeon: Serafina Mitchell, MD;  Location: [facility];  Service: Vascular;  Laterality: N/A;    Current Outpatient Prescriptions  Medication Sig Dispense Refill  . amLODipine (NORVASC) 2.5 MG tablet Take 2.5 mg by mouth daily.     . Aspirin 81 MG EC tablet Take 81 mg by mouth every evening.     . cholecalciferol (VITAMIN D) 1000 UNITS tablet Take 1,000 Units by mouth daily. D3    . cholestyramine (QUESTRAN) 4 G packet Take 1 packet by mouth 2 (two) times daily. Take in 4 oz orange juice followed by 8 oz water    . Hydrocodone-Acetaminophen (VICODIN) 5-300 MG TABS Take 5 mg by  mouth every 6 (six) hours as needed. 15 each 0  . Multiple Vitamin (MULTIVITAMIN) tablet Take 1 tablet by mouth daily. Without iron    . nitroGLYCERIN (NITROSTAT) 0.4 MG SL tablet Place 0.4 mg under the tongue every 5 (five) minutes x 3 doses as needed. For chest pain    . Tamsulosin HCl (FLOMAX) 0.4 MG CAPS Take 0.4 mg by mouth every morning.     . valsartan-hydrochlorothiazide (DIOVAN-HCT) 160-25 MG per tablet Take 0.5 tablets by mouth daily.     . vitamin E (VITAMIN E) 400 UNIT capsule Take 400 Units by mouth every morning.     . vitamin k 100 MCG tablet Take 50 mcg by mouth once a week. Mondays     No current facility-administered medications for this visit.    Allergies  Allergen Reactions  . Crestor [Rosuvastatin] Other (See Comments)    Myalgias  . Hygroton [Chlorthalidone] Other (See Comments)    Syncope  . Lipitor  [Atorvastatin] Other (See Comments)    Dizziness  . Metamucil [Psyllium] Other (See Comments)    Syncope  . Mevacor [Lovastatin] Other (See Comments)    Muscle cramps  . Pravastatin Other (See Comments)    Myalgias  . Vytorin [Ezetimibe-Simvastatin] Other (See Comments)    Myalgias  . Zetia [Ezetimibe] Other (See Comments)    Myalgias  . Toviaz [Fesoterodine Fumarate Er] Rash    Review of Systems negative except from HPI and PMH  Physical Exam BP 158/90 mmHg  Pulse 71  Ht 5\' 5"  (1.651 m)  Wt 152 lb 9.6 oz (69.219 kg)  BMI 25.39 kg/m2 Well developed and well nourished in no acute distress HENT normal E scleral and icterus clear Neck Supple JVP flat; carotids brisk and full Clear to ausculation Device pocket well healed; without hematoma or erythema.  There is no tethering  * Regular rate and rhythm, no murmurs gallops or rub Soft with active bowel sounds No clubbing cyanosis  Edema Alert and oriented, grossly normal motor and sensory function walking with a stick  Skin Warm and Dry  ECG  Atrial pacing at [address] 13/11/40  Assessment and  Plan  Sinus node dysfunction  Hypertension  Syncope  Neurally mediated  Pacemaker  St Jude The patient's device was interrogated and the information was fully reviewed.  The device was reprogrammed to increase ventricular outputs; notably, he paces in his ventricle almost never.  Stable rhythm  While his chart says he has atrial fibrillation none noted on his device that lasting more than seconds. He is not on anticoagulation. This is appropriate.  Syncope continues to sound neurally mediated. I've told him, that recumbency upon, awareness of the prodrome is key  None in two years  Blood pressure is elevated, however, given the issues of syncope which seems to be vasomotor I'm a little bit reluctant to increase his blood pressure medication  Blood pressure is moderately elevated. It has not been so at home. We will follow  along.

## 2015-03-29 NOTE — Patient Instructions (Signed)
Medication Instructions: - no changes  Labwork: - none  Procedures/Testing: - none  Follow-Up: - Remote monitoring is used to monitor your Pacemaker of ICD from home. This monitoring reduces the number of office visits required to check your device to one time per year. It allows [place] to keep an eye on the functioning of your device to ensure it is working properly. You are scheduled for a device check from home on 06/28/15. You may send your transmission at any time that day. If you have a wireless device, the transmission will be sent automatically. After your physician reviews your transmission, you will receive a postcard with your next transmission date.  - Your physician wants you to follow-up in: 1 year with Chanetta Marshall, NP for Dr. Caryl Comes. You will receive a reminder letter in the mail two months in advance. If you don't receive a letter, please call our office to schedule the follow-up appointment.  Any Additional Special Instructions Will Be Listed Below (If Applicable).

## 2015-04-10 DIAGNOSIS — R351 Nocturia: Secondary | ICD-10-CM | POA: Diagnosis not present

## 2015-04-10 DIAGNOSIS — R3121 Asymptomatic microscopic hematuria: Secondary | ICD-10-CM | POA: Diagnosis not present

## 2015-04-10 DIAGNOSIS — N138 Other obstructive and reflux uropathy: Secondary | ICD-10-CM | POA: Diagnosis not present

## 2015-05-22 DIAGNOSIS — N402 Nodular prostate without lower urinary tract symptoms: Secondary | ICD-10-CM | POA: Diagnosis not present

## 2015-05-22 DIAGNOSIS — H6120 Impacted cerumen, unspecified ear: Secondary | ICD-10-CM | POA: Diagnosis not present

## 2015-05-22 DIAGNOSIS — I10 Essential (primary) hypertension: Secondary | ICD-10-CM | POA: Diagnosis not present

## 2015-05-22 DIAGNOSIS — L219 Seborrheic dermatitis, unspecified: Secondary | ICD-10-CM | POA: Diagnosis not present

## 2015-05-22 DIAGNOSIS — I2510 Atherosclerotic heart disease of native coronary artery without angina pectoris: Secondary | ICD-10-CM | POA: Diagnosis not present

## 2015-06-28 ENCOUNTER — Encounter: Payer: Medicare Other | Admitting: *Deleted

## 2015-06-28 ENCOUNTER — Telehealth: Payer: Self-pay | Admitting: Cardiology

## 2015-06-28 NOTE — Telephone Encounter (Signed)
Spoke with pt and reminded pt of remote transmission that is due today. Pt verbalized understanding.   

## 2015-07-02 ENCOUNTER — Encounter: Payer: Self-pay | Admitting: Cardiology

## 2015-07-03 ENCOUNTER — Ambulatory Visit (INDEPENDENT_AMBULATORY_CARE_PROVIDER_SITE_OTHER): Payer: Medicare Other | Admitting: *Deleted

## 2015-07-03 DIAGNOSIS — I495 Sick sinus syndrome: Secondary | ICD-10-CM | POA: Diagnosis not present

## 2015-07-06 NOTE — Progress Notes (Signed)
Remote pacemaker transmission.   

## 2015-07-09 LAB — CUP PACEART REMOTE DEVICE CHECK
Battery Remaining Longevity: 91 mo
Battery Voltage: 2.95 V
Brady Statistic AS VS Percent: 6 %
Brady Statistic RA Percent Paced: 94 %
Implantable Lead Implant Date: 20130708
Implantable Lead Location: 753859
Lead Channel Setting Sensing Sensitivity: 2 mV
MDC IDC LEAD IMPLANT DT: 20130708
MDC IDC LEAD LOCATION: 753860
MDC IDC LEAD MODEL: 1948
MDC IDC MSMT BATTERY REMAINING PERCENTAGE: 91 %
MDC IDC SESS DTM: 20170117145530
MDC IDC SET LEADCHNL RA PACING AMPLITUDE: 2 V
MDC IDC SET LEADCHNL RV PACING AMPLITUDE: 3.5 V
MDC IDC SET LEADCHNL RV PACING PULSEWIDTH: 0.6 ms
MDC IDC STAT BRADY AP VP PERCENT: 1 %
MDC IDC STAT BRADY AP VS PERCENT: 94 %
MDC IDC STAT BRADY AS VP PERCENT: 1 %
MDC IDC STAT BRADY RV PERCENT PACED: 1 %
Pulse Gen Serial Number: 7350984

## 2015-07-13 ENCOUNTER — Encounter: Payer: Self-pay | Admitting: Cardiology

## 2015-07-27 ENCOUNTER — Telehealth: Payer: Self-pay | Admitting: Cardiology

## 2015-07-27 NOTE — Telephone Encounter (Signed)
Informed pt that we did received his transmission on 07-03-15. Pt verbalized understanding.

## 2015-08-28 DIAGNOSIS — J209 Acute bronchitis, unspecified: Secondary | ICD-10-CM | POA: Diagnosis not present

## 2015-09-03 ENCOUNTER — Telehealth: Payer: Self-pay

## 2015-09-03 NOTE — Telephone Encounter (Signed)
Email contact from patient's daughter forwarded from [facility] website.  Patient has an appt on Friday 09/07/2015 with Dr Tamala Julian and patient's wife is unable to assist him from car, or push wheelchair.  Provided clinical manager's cell phone number to have patient's wife call when they arrive at the door of building so either manager or supervisor can assist patient with transport up to the office.  Georgana Curio MHA RN CCM

## 2015-09-07 ENCOUNTER — Ambulatory Visit (INDEPENDENT_AMBULATORY_CARE_PROVIDER_SITE_OTHER): Payer: Medicare Other | Admitting: Interventional Cardiology

## 2015-09-07 ENCOUNTER — Encounter: Payer: Self-pay | Admitting: Surgery

## 2015-09-07 ENCOUNTER — Encounter: Payer: Self-pay | Admitting: Interventional Cardiology

## 2015-09-07 VITALS — BP 158/84 | HR 68 | Ht 65.0 in | Wt 149.6 lb

## 2015-09-07 DIAGNOSIS — Z9889 Other specified postprocedural states: Secondary | ICD-10-CM

## 2015-09-07 DIAGNOSIS — I495 Sick sinus syndrome: Secondary | ICD-10-CM

## 2015-09-07 DIAGNOSIS — I480 Paroxysmal atrial fibrillation: Secondary | ICD-10-CM | POA: Diagnosis not present

## 2015-09-07 DIAGNOSIS — I255 Ischemic cardiomyopathy: Secondary | ICD-10-CM

## 2015-09-07 DIAGNOSIS — Z950 Presence of cardiac pacemaker: Secondary | ICD-10-CM

## 2015-09-07 DIAGNOSIS — Z8679 Personal history of other diseases of the circulatory system: Secondary | ICD-10-CM

## 2015-09-07 DIAGNOSIS — I25810 Atherosclerosis of coronary artery bypass graft(s) without angina pectoris: Secondary | ICD-10-CM

## 2015-09-07 NOTE — Patient Instructions (Signed)
Medication Instructions:   Your physician recommends that you continue on your current medications as directed. Please refer to the Current Medication list given to you today.   If you need a refill on your cardiac medications before your next appointment, please call your pharmacy.  Labwork:  NONE ORDER TODAY    Testing/Procedures:  NONE ORDER TODAY    Follow-Up:  Your physician wants you to follow-up in: [place] will receive a reminder letter in the mail two months in advance. If you don't receive a letter, please call our office to schedule the follow-up appointment.    Any Other Special Instructions Will Be Listed Below (If Applicable).

## 2015-09-07 NOTE — Progress Notes (Signed)
Cardiology Office Note   Date:  09/07/2015   ID:  Edward Cannon, DOB January 12, 1926, MRN [id number]  PCP:  Henrine Screws, MD  Cardiologist:  Sinclair Grooms, MD   Chief Complaint  Patient presents with  . Coronary Artery Disease      History of Present Illness: Edward Cannon is a 82 y.o. male who presents for CAD, tachybradycardia syndrome, permanent pacemaker, and essential hypertension.  The scanning denies chest pain, dyspnea, edema, palpitations, and syncope. Has not seen a primary care physician, Dr. Inda Merlin in quite some time. He is having no side effects to is relatively simple medical regimen. It is good.    Past Medical History  Diagnosis Date  . Hypertension   . Hyperlipidemia   . Microscopic hematuria   . Coronary artery disease S/P CABG   . Atrial fibrillation ([place])   . Bouchard nodes (DJD hand)   . Hx of colonic polyps   . Ventral hernia     Asymptomatic  . Diverticulosis     Wtih Anatomotic friability on colonscopy  . Urinary urgency     With Incontinence  . BPH (benign prostatic hypertrophy)   . 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 Syncope     Recurrent, second to Bradycardia  . Sinus node dysfunction   . Anginal pain ([place]) 2005  . Pacemaker   . History of bronchitis winter 2012  . H/O scarlet fever 1936  . Leg weakness, bilateral 10/09/2014  . Abnormality of gait 10/09/2014  . AAA (abdominal aortic aneurysm) ([place]) 10/04/14    Per  Dr. Viviana Simpler    Past Surgical History  Procedure Laterality Date  . [place]      Obtuse marginal branch of the left circumflex   . Rima      to the LAD  . Svg  2005    to posterior lateral branch of the RCA  . Low anterior bowel resection      for large villous  . Colon resection  ~ 2005  . Coronary artery bypass graft  ~ 2009    CABG X3  . Mastoidectomy  1930  . Cardiac catheterization    . Colon surgery    . Insert / replace / remove pacemaker  12/22/11  .  Permanent pacemaker insertion N/A 12/22/2011    Procedure: PERMANENT PACEMAKER INSERTION;  Surgeon: Deboraha Sprang, MD;  Location: [facility] CATH LAB;  Service: Cardiovascular;  Laterality: N/A;  . Cataract extraction Bilateral   . Eye surgery Bilateral 2012  . Renal artery stent Left 02/15/2015    Stenting and angioplasty of left renal artery  . Abdominal aortic endovascular fenestrated stent graft N/A 02/15/2015    Procedure: ABDOMINAL AORTIC ENDOVASCULAR FENESTRATED STENT GRAFT;  Surgeon: Serafina Mitchell, MD;  Location: [facility];  Service: Vascular;  Laterality: N/A;     Current Outpatient Prescriptions  Medication Sig Dispense Refill  . amLODipine (NORVASC) 2.5 MG tablet Take 2.5 mg by mouth daily.     . Aspirin 81 MG EC tablet Take 81 mg by mouth every evening.     . nitroGLYCERIN (NITROSTAT) 0.4 MG SL tablet Place 0.4 mg under the tongue every 5 (five) minutes x 3 doses as needed. For chest pain    . Tamsulosin HCl (FLOMAX) 0.4 MG CAPS Take 0.4 mg by mouth every morning.     . valsartan-hydrochlorothiazide (DIOVAN-HCT) 160-25 MG per tablet Take 0.5 tablets by mouth daily.     Marland Kitchen  vitamin E (VITAMIN E) 400 UNIT capsule Take 400 Units by mouth every morning.     . vitamin k 100 MCG tablet Take 50 mcg by mouth once a week. Mondays     No current facility-administered medications for this visit.    Allergies:   Crestor; Hygroton; Lipitor; Metamucil; Mevacor; Pravastatin; Vytorin; Zetia; and Toviaz    Social History:  The patient  reports that he has never smoked. He has never used smokeless tobacco. He reports that he drinks about 0.6 oz of alcohol per week. He reports that he does not use illicit drugs.   Family History:  The patient's family history includes CAD in his mother; COPD in his father; Coronary artery disease in his brother; Diabetes in his mother; Heart disease in his mother; Heart failure in his brother; Other in his brother; Pancreatic cancer in his brother; Prostate cancer in his  father.    ROS:  Please see the history of present illness.   Otherwise, review of systems are positive for He has recently been troubled by cough. His been going on for about a week and is now improving. He has constipation and lower extremity pain. He has had history of syncope but no episodes in over one year..   All other systems are reviewed and negative.    PHYSICAL EXAM: VS:  BP 158/84 mmHg  Pulse 68  Ht 5\' 5"  (1.651 m)  Wt 149 lb 9.6 oz (67.858 kg)  BMI 24.89 kg/m2  SpO2 99% , BMI Body mass index is 24.89 kg/(m^2). GEN: Well nourished, well developed, in no acute distress HEENT: normal Neck: no JVD, carotid bruits, or masses Cardiac: RRR.  There is no murmur, rub, or gallop. There is no edema. Respiratory:  clear to auscultation bilaterally, normal work of breathing. GI: soft, nontender, nondistended, + BS MS: no deformity or atrophy Skin: warm and dry, no rash Neuro:  Strength and sensation are intact Psych: euthymic mood, full affect   EKG:  EKG is not ordered today. The ekg done on October   Recent Labs: 02/09/2015: ALT 13* 02/15/2015: Magnesium 1.8 02/18/2015: BUN 32*; Potassium 3.8; Sodium 130* 02/19/2015: Creatinine, Ser 2.39*; Hemoglobin 9.8*; Platelets 99*    Lipid Panel No results found for: CHOL, TRIG, HDL, CHOLHDL, VLDL, LDLCALC, LDLDIRECT    Wt Readings from Last 3 Encounters:  09/07/15 149 lb 9.6 oz (67.858 kg)  03/29/15 152 lb 9.6 oz (69.219 kg)  03/19/15 145 lb (65.772 kg)      Other studies Reviewed: Additional studies/ records that were reviewed today include: pacer records reviewed. The findings include normal function when last evaluated in November.    ASSESSMENT AND PLAN:  1. Coronary artery disease involving coronary bypass graft of native heart without angina pectoris Stable without angina  2. Status post percutaneous abdominal aortic aneurysm repair Stable  3. Paroxysmal atrial fibrillation (HCC) No current symptoms to suggest the  presence of A. fib  4. Pacemaker-St.Jude Last pacer check was normal  5. Ischemic cardiomyopathy No evidence of volume overload or symptoms of heart failure  6. Sinus node dysfunction (HCC) Treated with pacemaker    Current medicines are reviewed at length with the patient today.  The patient has the following concerns regarding medicines: None.  The following changes/actions have been instituted:    No changes in therapy. Notified to call if he's has edema or dyspnea or angina   Labs/ tests ordered today include:  No orders of the defined types were placed in this  encounter.     Disposition:   FU with HS in 1 year  Signed, Sinclair Grooms, MD  09/07/2015 12:02 PM    [address], [place], [place]  [address] Phone: [phone]; Fax: [phone]

## 2015-09-17 ENCOUNTER — Ambulatory Visit (INDEPENDENT_AMBULATORY_CARE_PROVIDER_SITE_OTHER): Payer: Medicare Other | Admitting: Surgery

## 2015-09-17 ENCOUNTER — Encounter: Payer: Self-pay | Admitting: Surgery

## 2015-09-17 ENCOUNTER — Ambulatory Visit (HOSPITAL_COMMUNITY)
Admission: RE | Admit: 2015-09-17 | Discharge: 2015-09-17 | Disposition: A | Discharge status: Home / Self Care | Payer: Medicare Other | Source: Ambulatory Visit | Attending: Surgery | Admitting: Surgery

## 2015-09-17 VITALS — BP 170/76 | HR 65 | Temp 97.8°F | Resp 16 | Ht 65.0 in | Wt 147.0 lb

## 2015-09-17 DIAGNOSIS — I2510 Atherosclerotic heart disease of native coronary artery without angina pectoris: Secondary | ICD-10-CM | POA: Insufficient documentation

## 2015-09-17 DIAGNOSIS — I255 Ischemic cardiomyopathy: Secondary | ICD-10-CM

## 2015-09-17 DIAGNOSIS — E785 Hyperlipidemia, unspecified: Secondary | ICD-10-CM | POA: Diagnosis not present

## 2015-09-17 DIAGNOSIS — Z951 Presence of aortocoronary bypass graft: Secondary | ICD-10-CM | POA: Diagnosis not present

## 2015-09-17 DIAGNOSIS — I714 Abdominal aortic aneurysm, without rupture, unspecified: Secondary | ICD-10-CM

## 2015-09-17 DIAGNOSIS — I10 Essential (primary) hypertension: Secondary | ICD-10-CM | POA: Insufficient documentation

## 2015-09-17 NOTE — Progress Notes (Signed)
Patient name: Edward Cannon MRN: [id number] DOB: 19-Jul-1925 Sex: male     Chief Complaint  Patient presents with  . AAA    follow up from [facility]  02-16-15. Pt has no complaints other than his usual myalgias.    HISTORY OF PRESENT ILLNESS: The patient is back today for follow-up. On 02/18/2015 he underwent a fenestrated endovascular repair of a juxtarenal abdominal aortic aneurysm including stenting of his left renal artery. This was done using a ZFEN device. The patient's only postoperative issue has been an increase in his renal function above his baseline. At the time of the operation he had a hemodynamically significant left renal artery stenosis which was stented. He reports no complaints today. He did have some neck pain after surgery but that has resolved  Past Medical History  Diagnosis Date  . Hypertension   . Hyperlipidemia   . Microscopic hematuria   . Coronary artery disease S/P CABG   . Atrial fibrillation ([place])   . Bouchard nodes (DJD hand)   . Hx of colonic polyps   . Ventral hernia     Asymptomatic  . Diverticulosis     Wtih Anatomotic friability on colonscopy  . Urinary urgency     With Incontinence  . BPH (benign prostatic hypertrophy)   . 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 Syncope     Recurrent, second to Bradycardia  . Sinus node dysfunction   . Anginal pain ([place]) 2005  . Pacemaker   . History of bronchitis winter 2012  . H/O scarlet fever 1936  . Leg weakness, bilateral 10/09/2014  . Abnormality of gait 10/09/2014  . AAA (abdominal aortic aneurysm) ([place]) 10/04/14    Per  Dr. Viviana Simpler    Past Surgical History  Procedure Laterality Date  . [place]      Obtuse marginal branch of the left circumflex   . Rima      to the LAD  . Svg  2005    to posterior lateral branch of the RCA  . Low anterior bowel resection      for large villous  . Colon resection  ~ 2005  . Coronary artery bypass graft  ~ 2009   CABG X3  . Mastoidectomy  1930  . Cardiac catheterization    . Colon surgery    . Insert / replace / remove pacemaker  12/22/11  . Permanent pacemaker insertion N/A 12/22/2011    Procedure: PERMANENT PACEMAKER INSERTION;  Surgeon: Deboraha Sprang, MD;  Location: [facility] CATH LAB;  Service: Cardiovascular;  Laterality: N/A;  . Cataract extraction Bilateral   . Eye surgery Bilateral 2012  . Renal artery stent Left 02/15/2015    Stenting and angioplasty of left renal artery  . Abdominal aortic endovascular fenestrated stent graft N/A 02/15/2015    Procedure: ABDOMINAL AORTIC ENDOVASCULAR FENESTRATED STENT GRAFT;  Surgeon: Serafina Mitchell, MD;  Location: [facility] OR;  Service: Vascular;  Laterality: N/A;    Social History   Social History  . Marital Status: Married    Spouse Name: N/A  . Number of Children: 1  . Years of Education: PhD   Occupational History  . retired    Social History Main Topics  . Smoking status: Never Smoker   . Smokeless tobacco: Never Used  . Alcohol Use: No  . Drug Use: No  . Sexual Activity: No   Other Topics Concern  . Not on file  Social History Narrative   Patient is right handed.   Patient does not drink caffeine.    Family History  Problem Relation Age of Onset  . Diabetes Mother   . Heart disease Mother     Pacemaker  . CAD Mother   . COPD Father   . Prostate cancer Father   . Pancreatic cancer Brother   . Other Brother     CABG  . Heart failure Brother   . Coronary artery disease Brother     Allergies as of 09/17/2015 - Review Complete 09/17/2015  Allergen Reaction Noted  . Crestor [rosuvastatin] Other (See Comments) 12/11/2011  . Hygroton [chlorthalidone] Other (See Comments) 12/11/2011  . Lipitor [atorvastatin] Other (See Comments) 12/11/2011  . Metamucil [psyllium] Other (See Comments) 12/11/2011  . Mevacor [lovastatin] Other (See Comments) 12/11/2011  . Pravastatin Other (See Comments) 12/11/2011  . Vytorin [ezetimibe-simvastatin] Other  (See Comments) 12/11/2011  . Zetia [ezetimibe] Other (See Comments) 12/11/2011  . Toviaz [fesoterodine fumarate er] Rash 10/06/2014    Current Outpatient Prescriptions on File Prior to Visit  Medication Sig Dispense Refill  . amLODipine (NORVASC) 2.5 MG tablet Take 2.5 mg by mouth daily.     . Aspirin 81 MG EC tablet Take 81 mg by mouth every evening.     . nitroGLYCERIN (NITROSTAT) 0.4 MG SL tablet Place 0.4 mg under the tongue every 5 (five) minutes x 3 doses as needed. For chest pain    . Tamsulosin HCl (FLOMAX) 0.4 MG CAPS Take 0.4 mg by mouth every morning.     . valsartan-hydrochlorothiazide (DIOVAN-HCT) 160-25 MG per tablet Take 0.5 tablets by mouth daily.     . vitamin E (VITAMIN E) 400 UNIT capsule Take 400 Units by mouth every morning.     . vitamin k 100 MCG tablet Take 50 mcg by mouth once a week. Mondays     No current facility-administered medications on file prior to visit.     REVIEW OF SYSTEMS: No interval change  PHYSICAL EXAMINATION:   Vital signs are  Filed Vitals:   09/17/15 0934 09/17/15 0940  BP: 156/76 170/76  Pulse: 66 65  Temp: 97.8 F (36.6 C)   TempSrc: Oral   Resp: 16   Height: 5\' 5"  (1.651 m)   Weight: 147 lb (66.679 kg)   SpO2: 95%    Body mass index is 24.46 kg/(m^2). General: The patient appears their stated age. HEENT:  No gross abnormalities Pulmonary:  Non labored breathing Abdomen: Soft and non-tender.  No abdominal bruit Musculoskeletal: There are no major deformities. Neurologic: No focal weakness or paresthesias are detected, Skin: There are no ulcer or rashes noted. Psychiatric: The patient has normal affect. Cardiovascular: There is a regular rate and rhythm without significant murmur appreciated.   Diagnostic Studies I have reviewed his ultrasound.  Maximum aortic diameter presurgery was 5.56.  Today maximum diameter is 4.6.  No evidence of endoleak.  Assessment: Status post fenestrated endovascular aneurysm  repair Plan: The patient's aneurysm continues to decrease in size.  Unfortunately, the renal stent was not evaluated today.  He does not report any new renal symptoms.  I will have him follow up again in 6 months with a duplex of his aorta and renal arteries.  Eldridge Abrahams, M.D. [facility] Office: [phone] Pager:  [phone]

## 2015-09-21 ENCOUNTER — Encounter: Payer: Self-pay | Admitting: Internal Medicine

## 2015-09-21 ENCOUNTER — Ambulatory Visit (INDEPENDENT_AMBULATORY_CARE_PROVIDER_SITE_OTHER): Payer: Medicare Other | Admitting: *Deleted

## 2015-09-21 DIAGNOSIS — Z950 Presence of cardiac pacemaker: Secondary | ICD-10-CM | POA: Diagnosis not present

## 2015-09-21 LAB — CUP PACEART INCLINIC DEVICE CHECK
Brady Statistic RA Percent Paced: 94 %
Brady Statistic RV Percent Paced: 0.09 %
Date Time Interrogation Session: 20170407120623
Implantable Lead Implant Date: 20130708
Implantable Lead Location: 753859
Lead Channel Impedance Value: 525 Ohm
Lead Channel Pacing Threshold Amplitude: 0.75 V
Lead Channel Pacing Threshold Amplitude: 1.25 V
Lead Channel Pacing Threshold Pulse Width: 0.4 ms
Lead Channel Sensing Intrinsic Amplitude: 11.4 mV
Lead Channel Sensing Intrinsic Amplitude: 3.9 mV
Lead Channel Setting Pacing Amplitude: 3.5 V
Lead Channel Setting Sensing Sensitivity: 2 mV
MDC IDC LEAD IMPLANT DT: 20130708
MDC IDC LEAD LOCATION: 753860
MDC IDC LEAD MODEL: 1948
MDC IDC MSMT BATTERY REMAINING LONGEVITY: 118.8
MDC IDC MSMT BATTERY VOLTAGE: 2.95 V
MDC IDC MSMT LEADCHNL RA IMPEDANCE VALUE: 425 Ohm
MDC IDC MSMT LEADCHNL RV PACING THRESHOLD PULSEWIDTH: 0.6 ms
MDC IDC SET LEADCHNL RA PACING AMPLITUDE: 2 V
MDC IDC SET LEADCHNL RV PACING PULSEWIDTH: 0.6 ms
Pulse Gen Serial Number: 7350984

## 2015-09-21 NOTE — Progress Notes (Signed)
Pacemaker check in clinic, patient walked-in, added-on per Dr. Tamala Julian. Normal device function. Thresholds, sensing, impedances consistent with previous measurements. Device programmed to maximize longevity. 26 AMS episodes (<1%), +ASA, EGMs suggest competitive pacing and 1:1 SVT/AT, longest ~10sec. No high ventricular rates noted. Device programmed at appropriate safety margins. Histogram distribution appropriate for patient activity level. Device programmed to optimize intrinsic conduction. Estimated longevity 5.7-9.8 years. Patient education completed. Merlin on 12/24/15 and ROV with SK in 03/2016.

## 2015-09-21 NOTE — Patient Instructions (Signed)
Your pacemaker is working appropriately.  Continue taking all medications as prescribed.  You are scheduled for a remote transmission from your home monitor on 12/24/2015.   If you have any pacemaker-related questions, please call us at [phone].

## 2015-10-04 NOTE — Addendum Note (Signed)
Addended by: Thresa Ross C on: 10/04/2015 10:00 AM   Modules accepted: Orders

## 2015-10-09 ENCOUNTER — Other Ambulatory Visit: Payer: Self-pay | Admitting: Internal Medicine

## 2015-10-09 DIAGNOSIS — L219 Seborrheic dermatitis, unspecified: Secondary | ICD-10-CM | POA: Diagnosis not present

## 2015-10-09 DIAGNOSIS — E782 Mixed hyperlipidemia: Secondary | ICD-10-CM | POA: Diagnosis not present

## 2015-10-09 DIAGNOSIS — Z0001 Encounter for general adult medical examination with abnormal findings: Secondary | ICD-10-CM | POA: Diagnosis not present

## 2015-10-09 DIAGNOSIS — I2510 Atherosclerotic heart disease of native coronary artery without angina pectoris: Secondary | ICD-10-CM | POA: Diagnosis not present

## 2015-10-09 DIAGNOSIS — N400 Enlarged prostate without lower urinary tract symptoms: Secondary | ICD-10-CM | POA: Diagnosis not present

## 2015-10-09 DIAGNOSIS — Z1389 Encounter for screening for other disorder: Secondary | ICD-10-CM | POA: Diagnosis not present

## 2015-10-09 DIAGNOSIS — R32 Unspecified urinary incontinence: Secondary | ICD-10-CM | POA: Diagnosis not present

## 2015-10-09 DIAGNOSIS — H6123 Impacted cerumen, bilateral: Secondary | ICD-10-CM | POA: Diagnosis not present

## 2015-10-09 DIAGNOSIS — E559 Vitamin D deficiency, unspecified: Secondary | ICD-10-CM | POA: Diagnosis not present

## 2015-10-09 DIAGNOSIS — I10 Essential (primary) hypertension: Secondary | ICD-10-CM | POA: Diagnosis not present

## 2015-10-09 DIAGNOSIS — H6120 Impacted cerumen, unspecified ear: Secondary | ICD-10-CM | POA: Diagnosis not present

## 2015-10-09 DIAGNOSIS — N289 Disorder of kidney and ureter, unspecified: Secondary | ICD-10-CM

## 2015-10-09 DIAGNOSIS — Z79899 Other long term (current) drug therapy: Secondary | ICD-10-CM | POA: Diagnosis not present

## 2015-10-09 DIAGNOSIS — N33 Bladder disorders in diseases classified elsewhere: Secondary | ICD-10-CM | POA: Diagnosis not present

## 2015-10-12 ENCOUNTER — Ambulatory Visit
Admission: RE | Admit: 2015-10-12 | Discharge: 2015-10-12 | Disposition: A | Discharge status: Home / Self Care | Payer: Medicare Other | Source: Ambulatory Visit | Attending: Internal Medicine | Admitting: Internal Medicine

## 2015-10-12 DIAGNOSIS — N281 Cyst of kidney, acquired: Secondary | ICD-10-CM | POA: Diagnosis not present

## 2015-10-12 DIAGNOSIS — N289 Disorder of kidney and ureter, unspecified: Secondary | ICD-10-CM

## 2015-10-19 DIAGNOSIS — N184 Chronic kidney disease, stage 4 (severe): Secondary | ICD-10-CM | POA: Diagnosis not present

## 2015-10-19 DIAGNOSIS — I10 Essential (primary) hypertension: Secondary | ICD-10-CM | POA: Diagnosis not present

## 2015-12-24 ENCOUNTER — Telehealth: Payer: Self-pay | Admitting: Cardiology

## 2015-12-24 ENCOUNTER — Ambulatory Visit (INDEPENDENT_AMBULATORY_CARE_PROVIDER_SITE_OTHER): Payer: Medicare Other | Admitting: *Deleted

## 2015-12-24 DIAGNOSIS — I495 Sick sinus syndrome: Secondary | ICD-10-CM | POA: Diagnosis not present

## 2015-12-24 DIAGNOSIS — Z950 Presence of cardiac pacemaker: Secondary | ICD-10-CM | POA: Diagnosis not present

## 2015-12-24 NOTE — Telephone Encounter (Signed)
Spoke with pt and reminded pt of remote transmission that is due today. Pt verbalized understanding.   

## 2015-12-25 LAB — CUP PACEART REMOTE DEVICE CHECK
Battery Remaining Percentage: 91 %
Brady Statistic AP VP Percent: 1 %
Brady Statistic AS VP Percent: 1 %
Brady Statistic AS VS Percent: 5.4 %
Brady Statistic RV Percent Paced: 1 %
Implantable Lead Implant Date: 20130708
Implantable Lead Location: 753859
Lead Channel Impedance Value: 380 Ohm
Lead Channel Impedance Value: 450 Ohm
Lead Channel Sensing Intrinsic Amplitude: 3.4 mV
Lead Channel Sensing Intrinsic Amplitude: 9.4 mV
Lead Channel Setting Pacing Pulse Width: 0.6 ms
Lead Channel Setting Sensing Sensitivity: 2 mV
MDC IDC LEAD IMPLANT DT: 20130708
MDC IDC LEAD LOCATION: 753860
MDC IDC LEAD MODEL: 1948
MDC IDC MSMT BATTERY REMAINING LONGEVITY: 90 mo
MDC IDC MSMT BATTERY VOLTAGE: 2.95 V
MDC IDC PG SERIAL: 7350984
MDC IDC SESS DTM: 20170711013306
MDC IDC SET LEADCHNL RA PACING AMPLITUDE: 2 V
MDC IDC SET LEADCHNL RV PACING AMPLITUDE: 3.5 V
MDC IDC STAT BRADY AP VS PERCENT: 94 %
MDC IDC STAT BRADY RA PERCENT PACED: 95 %

## 2015-12-25 NOTE — Progress Notes (Signed)
Remote pacemaker transmission.   

## 2015-12-26 ENCOUNTER — Encounter: Payer: Self-pay | Admitting: Cardiology

## 2016-01-04 DIAGNOSIS — H353131 Nonexudative age-related macular degeneration, bilateral, early dry stage: Secondary | ICD-10-CM | POA: Diagnosis not present

## 2016-02-19 DIAGNOSIS — N184 Chronic kidney disease, stage 4 (severe): Secondary | ICD-10-CM | POA: Diagnosis not present

## 2016-02-19 DIAGNOSIS — D472 Monoclonal gammopathy: Secondary | ICD-10-CM | POA: Diagnosis not present

## 2016-02-27 DIAGNOSIS — I10 Essential (primary) hypertension: Secondary | ICD-10-CM | POA: Diagnosis not present

## 2016-02-27 DIAGNOSIS — N184 Chronic kidney disease, stage 4 (severe): Secondary | ICD-10-CM | POA: Diagnosis not present

## 2016-02-27 DIAGNOSIS — D631 Anemia in chronic kidney disease: Secondary | ICD-10-CM | POA: Diagnosis not present

## 2016-02-27 DIAGNOSIS — D472 Monoclonal gammopathy: Secondary | ICD-10-CM | POA: Diagnosis not present

## 2016-03-18 ENCOUNTER — Encounter: Payer: Self-pay | Admitting: Internal Medicine

## 2016-04-01 ENCOUNTER — Encounter: Payer: Self-pay | Admitting: Internal Medicine

## 2016-04-01 ENCOUNTER — Ambulatory Visit (INDEPENDENT_AMBULATORY_CARE_PROVIDER_SITE_OTHER): Payer: Medicare Other | Admitting: Internal Medicine

## 2016-04-01 VITALS — BP 148/68 | HR 72 | Ht 65.0 in | Wt 157.0 lb

## 2016-04-01 DIAGNOSIS — I495 Sick sinus syndrome: Secondary | ICD-10-CM

## 2016-04-01 DIAGNOSIS — R001 Bradycardia, unspecified: Secondary | ICD-10-CM | POA: Diagnosis not present

## 2016-04-01 DIAGNOSIS — I255 Ischemic cardiomyopathy: Secondary | ICD-10-CM | POA: Diagnosis not present

## 2016-04-01 DIAGNOSIS — Z950 Presence of cardiac pacemaker: Secondary | ICD-10-CM | POA: Diagnosis not present

## 2016-04-01 NOTE — Progress Notes (Signed)
Patient Care Team: Josetta Huddle, MD as PCP - General (Internal Medicine) Deboraha Sprang, MD as Consulting Physician (Cardiology) Belva Crome, MD as Consulting Physician (Cardiology) Kathrynn Ducking, MD as Consulting Physician (Neurology)   HPI  Edward Cannon is a 82 y.o. male Seen in followup for a pacemaker implanted 7/13 for sinus node dysfunction in the setting of ischemic heart disease with prior bypass surgery and normal left ventricular function. He also has a history of atrial fibrillation.   He was having syncope. Event recorder demonstrated a symptomatic pauses of greater than 3 seconds. He also has symptomatic bradycardia which prompted the implantation.   No intercurrent syncope  He denies problems with breathing chest pain or peripheral edema. There have been no palpitations.  Past Medical History:  Diagnosis Date  . AAA (abdominal aortic aneurysm) ([place]) 10/04/14   Per  Dr. Viviana Simpler  . Abnormality of gait 10/09/2014  . Anginal pain ([place]) 2005  . Atrial fibrillation ([place])   . Bouchard nodes (DJD hand)   . BPH (benign prostatic hypertrophy)   . Coronary artery disease S/P CABG   . Diverticulosis    Wtih Anatomotic friability on colonscopy  . H/O scarlet fever 1936  . History of bronchitis winter 2012  . Hx of colonic polyps   . Hyperlipidemia   . Hypertension   . Leg weakness, bilateral 10/09/2014  . Microscopic hematuria   . 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 Pacemaker   . Sinus node dysfunction   . Syncope    Recurrent, second to Bradycardia  . Urinary urgency    With Incontinence  . Ventral hernia    Asymptomatic    Past Surgical History:  Procedure Laterality Date  . ABDOMINAL AORTIC ENDOVASCULAR FENESTRATED STENT GRAFT N/A 02/15/2015   Procedure: ABDOMINAL AORTIC ENDOVASCULAR FENESTRATED STENT GRAFT;  Surgeon: Serafina Mitchell, MD;  Location: [facility];  Service: Vascular;  Laterality: N/A;  . CARDIAC  CATHETERIZATION    . CATARACT EXTRACTION Bilateral   . COLON RESECTION  ~ 2005  . COLON SURGERY    . CORONARY ARTERY BYPASS GRAFT  ~ 2009   CABG X3  . EYE SURGERY Bilateral 2012  . INSERT / REPLACE / REMOVE PACEMAKER  12/22/11  . [place]     Obtuse marginal branch of the left circumflex   . LOW ANTERIOR BOWEL RESECTION     for large villous  . MASTOIDECTOMY  1930  . PERMANENT PACEMAKER INSERTION N/A 12/22/2011   Procedure: PERMANENT PACEMAKER INSERTION;  Surgeon: Deboraha Sprang, MD;  Location: [facility] CATH LAB;  Service: Cardiovascular;  Laterality: N/A;  . RENAL ARTERY STENT Left 02/15/2015   Stenting and angioplasty of left renal artery  . RIMA     to the LAD  . SVG  2005   to posterior lateral branch of the RCA    Current Outpatient Prescriptions  Medication Sig Dispense Refill  . amLODipine (NORVASC) 2.5 MG tablet Take 2.5 mg by mouth daily.     . Aspirin 81 MG EC tablet Take 40.5 mg by mouth every evening.     . nitroGLYCERIN (NITROSTAT) 0.4 MG SL tablet Place 0.4 mg under the tongue every 5 (five) minutes x 3 doses as needed. For chest pain    . Tamsulosin HCl (FLOMAX) 0.4 MG CAPS Take 0.4 mg by mouth every morning.     . valsartan-hydrochlorothiazide (DIOVAN-HCT) 160-25 MG per tablet Take 0.5  tablets by mouth daily.     . vitamin E (VITAMIN E) 400 UNIT capsule Take 400 Units by mouth every morning.     . vitamin k 100 MCG tablet Take 50 mcg by mouth once a week. Mondays     No current facility-administered medications for this visit.     Allergies  Allergen Reactions  . Crestor [Rosuvastatin] Other (See Comments)    Myalgias  . Hygroton [Chlorthalidone] Other (See Comments)    Syncope  . Lipitor [Atorvastatin] Other (See Comments)    Dizziness  . Metamucil [Psyllium] Other (See Comments)    Syncope  . Mevacor [Lovastatin] Other (See Comments)    Muscle cramps  . Pravastatin Other (See Comments)    Myalgias  . Vytorin [Ezetimibe-Simvastatin] Other (See Comments)    Myalgias   . Zetia [Ezetimibe] Other (See Comments)    Myalgias  . Toviaz [Fesoterodine Fumarate Er] Rash    Review of Systems negative except from HPI and PMH  Physical Exam BP (!) 148/68   Pulse 72   Ht 5\' 5"  (1.651 m)   Wt 157 lb (71.2 kg)   SpO2 99%   BMI 26.13 kg/m  Well developed and well nourished in no acute distress HENT normal E scleral and icterus clear Neck Supple JVP flat; carotids brisk and full Clear to ausculation Device pocket well healed; without hematoma or erythema.  There is no tethering    Regular rate and rhythm, no murmurs gallops or rub Soft with active bowel sounds No clubbing cyanosis  Edema Alert and oriented, grossly normal motor and sensory function walking with a stick  Skin Warm and Dry  ECG  Atrial pacing at 66 20s/11/43  Assessment and  Plan  Sinus node dysfunction  Hypertension  Ischemic heart disease  Syncope     Pacemaker  St Jude The patient's device was interrogated and the information was fully reviewed.  The device was reprogrammed to  decrease ventricular outputs; notably, he paces in his ventricle almost never.   BP reasonably controlled.  Without symptoms of ischemia

## 2016-04-01 NOTE — Patient Instructions (Addendum)

## 2016-04-03 LAB — CUP PACEART INCLINIC DEVICE CHECK
Implantable Lead Implant Date: 20130708
Implantable Lead Location: 753859
Lead Channel Setting Pacing Amplitude: 2 V
MDC IDC LEAD IMPLANT DT: 20130708
MDC IDC LEAD LOCATION: 753860
MDC IDC LEAD MODEL: 1948
MDC IDC SESS DTM: 20171019164458
MDC IDC SET LEADCHNL RV PACING AMPLITUDE: 3.5 V
MDC IDC SET LEADCHNL RV PACING PULSEWIDTH: 0.6 ms
MDC IDC SET LEADCHNL RV SENSING SENSITIVITY: 2 mV
Pulse Gen Model: 2110
Pulse Gen Serial Number: 7350984

## 2016-04-07 DIAGNOSIS — I714 Abdominal aortic aneurysm, without rupture: Secondary | ICD-10-CM | POA: Diagnosis not present

## 2016-04-07 DIAGNOSIS — E559 Vitamin D deficiency, unspecified: Secondary | ICD-10-CM | POA: Diagnosis not present

## 2016-04-07 DIAGNOSIS — N33 Bladder disorders in diseases classified elsewhere: Secondary | ICD-10-CM | POA: Diagnosis not present

## 2016-04-07 DIAGNOSIS — H6120 Impacted cerumen, unspecified ear: Secondary | ICD-10-CM | POA: Diagnosis not present

## 2016-04-07 DIAGNOSIS — E782 Mixed hyperlipidemia: Secondary | ICD-10-CM | POA: Diagnosis not present

## 2016-04-07 DIAGNOSIS — I10 Essential (primary) hypertension: Secondary | ICD-10-CM | POA: Diagnosis not present

## 2016-04-07 DIAGNOSIS — R32 Unspecified urinary incontinence: Secondary | ICD-10-CM | POA: Diagnosis not present

## 2016-04-07 DIAGNOSIS — I2510 Atherosclerotic heart disease of native coronary artery without angina pectoris: Secondary | ICD-10-CM | POA: Diagnosis not present

## 2016-04-07 DIAGNOSIS — R269 Unspecified abnormalities of gait and mobility: Secondary | ICD-10-CM | POA: Diagnosis not present

## 2016-04-08 DIAGNOSIS — R351 Nocturia: Secondary | ICD-10-CM | POA: Diagnosis not present

## 2016-04-08 DIAGNOSIS — N3941 Urge incontinence: Secondary | ICD-10-CM | POA: Diagnosis not present

## 2016-04-08 DIAGNOSIS — R3915 Urgency of urination: Secondary | ICD-10-CM | POA: Diagnosis not present

## 2016-04-08 DIAGNOSIS — N401 Enlarged prostate with lower urinary tract symptoms: Secondary | ICD-10-CM | POA: Diagnosis not present

## 2016-04-16 ENCOUNTER — Encounter: Payer: Self-pay | Admitting: Surgery

## 2016-04-21 ENCOUNTER — Ambulatory Visit (HOSPITAL_COMMUNITY)
Admission: RE | Admit: 2016-04-21 | Discharge: 2016-04-21 | Disposition: A | Discharge status: Home / Self Care | Payer: Medicare Other | Source: Ambulatory Visit | Attending: Surgery | Admitting: Surgery

## 2016-04-21 ENCOUNTER — Ambulatory Visit (INDEPENDENT_AMBULATORY_CARE_PROVIDER_SITE_OTHER)
Admission: RE | Admit: 2016-04-21 | Discharge: 2016-04-21 | Disposition: A | Discharge status: Home / Self Care | Payer: Medicare Other | Source: Ambulatory Visit | Attending: Surgery | Admitting: Surgery

## 2016-04-21 ENCOUNTER — Ambulatory Visit (INDEPENDENT_AMBULATORY_CARE_PROVIDER_SITE_OTHER): Payer: Medicare Other | Admitting: Surgery

## 2016-04-21 VITALS — BP 148/72 | HR 73 | Temp 97.8°F | Resp 16 | Ht 65.0 in | Wt 151.0 lb

## 2016-04-21 DIAGNOSIS — I255 Ischemic cardiomyopathy: Secondary | ICD-10-CM

## 2016-04-21 DIAGNOSIS — I714 Abdominal aortic aneurysm, without rupture, unspecified: Secondary | ICD-10-CM

## 2016-04-21 NOTE — Progress Notes (Signed)
[facility] [place]  Patient name: Edward Cannon MRN: [id number] DOB: 11/19/1925 Sex: male  REASON FOR VISIT: Follow up   HPI: The patient is back today for follow-up. On 02/18/2015 he underwent a fenestrated endovascular repair of a juxtarenal abdominal aortic aneurysm including stenting of his left renal artery. This was done using a ZFEN device. The patient's only postoperative issue has been an increase in his renal function above his baseline. At the time of the operation he had a hemodynamically significant left renal artery stenosis which was stented. He reports no complaints today.  He most recently saw nephrology on 02/27/2016.  His creatinine at that time was 2.68.  He has no complaints at this time.  Past Medical History:  Diagnosis Date  . AAA (abdominal aortic aneurysm) ([place]) 10/04/14   Per  Dr. Viviana Simpler  . Abnormality of gait 10/09/2014  . Anginal pain ([place]) 2005  . Atrial fibrillation ([place])   . Bouchard nodes ([place] hand)   . BPH (benign prostatic hypertrophy)   . Coronary artery disease S/P CABG   . Diverticulosis    [place]tih Anatomotic friability on colonscopy  . H/O scarlet fever 1936  . History of bronchitis winter 2012  . Hx of colonic polyps   . Hyperlipidemia   . Hypertension   . Leg weakness, bilateral 10/09/2014  . Microscopic hematuria   . 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 Pacemaker   . Sinus node dysfunction   . Syncope    Recurrent, second to Bradycardia  . Urinary urgency    With Incontinence  . Ventral hernia    Asymptomatic    Family History  Problem Relation Age of Onset  . Diabetes Mother   . Heart disease Mother     Pacemaker  . CAD Mother   . COPD Father   . Prostate cancer Father   . Pancreatic cancer Brother   . Other Brother     CABG  . Heart failure Brother   . Coronary artery disease Brother     SOCIAL HISTORY: Social  History  Substance Use Topics  . Smoking status: Never Smoker  . Smokeless tobacco: Never Used  . Alcohol use No    Allergies  Allergen Reactions  . Crestor [Rosuvastatin] Other (See Comments)    Myalgias  . Hygroton [Chlorthalidone] Other (See Comments)    Syncope  . Lipitor [Atorvastatin] Other (See Comments)    Dizziness  . Metamucil [Psyllium] Other (See Comments)    Syncope  . Mevacor [Lovastatin] Other (See Comments)    Muscle cramps  . Pravastatin Other (See Comments)    Myalgias  . Vytorin [Ezetimibe-Simvastatin] Other (See Comments)    Myalgias  . Zetia [Ezetimibe] Other (See Comments)    Myalgias  . Toviaz [Fesoterodine Fumarate Er] Rash    Current Outpatient Prescriptions  Medication Sig Dispense Refill  . amLODipine (NORVASC) 5 MG tablet     . Aspirin 81 MG EC tablet Take 40.5 mg by mouth every evening.     . finasteride (PROSCAR) 5 MG tablet     . nitroGLYCERIN (NITROSTAT) 0.4 MG SL tablet Place 0.4 mg under the tongue every 5 (five) minutes x 3 doses as needed. For chest pain    . Tamsulosin HCl (FLOMAX) 0.4 MG CAPS Take 0.4 mg by mouth every morning.     . valsartan-hydrochlorothiazide (DIOVAN-HCT) 160-25 MG per tablet Take 0.5 tablets by mouth daily.     Marland Kitchen  vitamin E (VITAMIN E) 400 UNIT capsule Take 400 Units by mouth every morning.     . vitamin k 100 MCG tablet Take 50 mcg by mouth once a week. Mondays     No current facility-administered medications for this visit.     REVIEW OF SYSTEMS:  [X]  denotes positive finding, [ ]  denotes negative finding Cardiac  Comments:  Chest pain or chest pressure:    Shortness of breath upon exertion:    Short of breath when lying flat:    Irregular heart rhythm:        Vascular    Pain in calf, thigh, or hip brought on by ambulation:    Pain in feet at night that wakes you up from your sleep:     Blood clot in your veins:    Leg swelling:         Pulmonary    Oxygen at home:    Productive cough:       Wheezing:         Neurologic    Sudden weakness in arms or legs:     Sudden numbness in arms or legs:     Sudden onset of difficulty speaking or slurred speech:    Temporary loss of vision in one eye:     Problems with dizziness:         Gastrointestinal    Blood in stool:     Vomited blood:         Genitourinary    Burning when urinating:     Blood in urine:        Psychiatric    Major depression:         Hematologic    Bleeding problems:    Problems with blood clotting too easily:        Skin    Rashes or ulcers:        Constitutional    Fever or chills:      PHYSICAL EXAM: Vitals:   04/21/16 0945 04/21/16 0948  BP: (!) 150/71 (!) 148/72  Pulse: 72 73  Resp: 16   Temp: 97.8 F (36.6 C)   TempSrc: Oral   SpO2: 100%   Weight: 151 lb (68.5 kg)   Height: 5\' 5"  (1.651 m)     GENERAL: The patient is a well-nourished male, in no acute distress. The vital signs are documented above. CARDIAC: There is a regular rate and rhythm. PULMONARY: There is good air exchange bilaterall ABDOMEN: Soft and non-tender with normal pitched bowel sounds.  MUSCULOSKELETAL: There are no major deformities or cyanosis. NEUROLOGIC: No focal weakness or paresthesias are detected. SKIN: There are no ulcers or rashes noted. PSYCHIATRIC: The patient has a normal affect.  DATA:  I have ordered and reviewed his vascular lab studies with the following findings:  Abdominal aortic duplex: Maximum diameter was 5.1 x 5.8 cm  Renal duplex: The left renal stent is widely patent.  The right renal artery (which was not stented) sees was not visualized  MEDICAL ISSUES: Status post fenestrated aneurysm repair: The patient had a stent within his left renal artery.  The right renal artery was not stented as it was well above the aneurysm as well as the stent graft.  He did have plaque at the ostium.  I'm concerned that he may have developed a stenosis and/or occlusion of the right renal artery.  I'm  going to evaluate this initially with a noncontrasted CT scan to see if the graft is  covered the ostium.  If not, we may need to consider angiography to determine if the renal artery is patent.  He will have a CT scan within the next week and follow-up afterwards.    [name], MD [facility] [phone] Pager [phone]

## 2016-04-23 NOTE — Addendum Note (Signed)
Addended by: Thresa Ross C on: 04/23/2016 01:55 PM   Modules accepted: Orders

## 2016-05-02 ENCOUNTER — Ambulatory Visit
Admission: RE | Admit: 2016-05-02 | Discharge: 2016-05-02 | Disposition: A | Discharge status: Home / Self Care | Payer: Medicare Other | Source: Ambulatory Visit | Attending: Surgery | Admitting: Surgery

## 2016-05-02 DIAGNOSIS — K5730 Diverticulosis of large intestine without perforation or abscess without bleeding: Secondary | ICD-10-CM | POA: Diagnosis not present

## 2016-05-02 DIAGNOSIS — I714 Abdominal aortic aneurysm, without rupture, unspecified: Secondary | ICD-10-CM

## 2016-05-14 ENCOUNTER — Encounter: Payer: Self-pay | Admitting: Surgery

## 2016-05-16 DIAGNOSIS — N401 Enlarged prostate with lower urinary tract symptoms: Secondary | ICD-10-CM | POA: Diagnosis not present

## 2016-05-16 DIAGNOSIS — R3915 Urgency of urination: Secondary | ICD-10-CM | POA: Diagnosis not present

## 2016-05-16 DIAGNOSIS — R351 Nocturia: Secondary | ICD-10-CM | POA: Diagnosis not present

## 2016-05-16 DIAGNOSIS — N3941 Urge incontinence: Secondary | ICD-10-CM | POA: Diagnosis not present

## 2016-05-19 ENCOUNTER — Ambulatory Visit (INDEPENDENT_AMBULATORY_CARE_PROVIDER_SITE_OTHER): Payer: Medicare Other | Admitting: Surgery

## 2016-05-19 VITALS — BP 153/75 | HR 65 | Temp 97.9°F | Ht 65.0 in | Wt 155.3 lb

## 2016-05-19 DIAGNOSIS — I714 Abdominal aortic aneurysm, without rupture, unspecified: Secondary | ICD-10-CM

## 2016-05-19 DIAGNOSIS — I255 Ischemic cardiomyopathy: Secondary | ICD-10-CM | POA: Diagnosis not present

## 2016-05-19 NOTE — Progress Notes (Signed)
[facility] [place]  Patient name: Edward Cannon MRN: [id number] DOB: 1926/03/28 Sex: male  REASON FOR VISIT: follow up  HPI: The patient is back today for follow-up. On 02/18/2015 he underwent a fenestrated endovascular repair of a juxtarenal abdominal aortic aneurysm including stenting of his left renal artery. This was done using a ZFEN device. The patient's only postoperative issue has been an increase in his renal function above his baseline. At the time of the operation he had a hemodynamically significant left renal artery stenosis which was stented. He reports no complaints today.  He most recently saw nephrology on 02/27/2016.  His creatinine at that time was 2.68.  I sent him for a CT scan to make sure his stent graft remained in good position, as the right kidney was not able to be visualized on ultrasound.  Past Medical History:  Diagnosis Date  . AAA (abdominal aortic aneurysm) ([place]) 10/04/14   Per  Dr. Viviana Simpler  . Abnormality of gait 10/09/2014  . Anginal pain ([place]) 2005  . Atrial fibrillation ([place])   . Bouchard nodes ([facility]JD hand)   . BPH (benign prostatic hypertrophy)   . Coronary artery disease S/P CABG   . Diverticulosis    [place] Anatomotic friability on colonscopy  . H/O scarlet fever 1936  . History of bronchitis winter 2012  . Hx of colonic polyps   . Hyperlipidemia   . Hypertension   . Leg weakness, bilateral 10/09/2014  . Microscopic hematuria   . 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 Pacemaker   . Sinus node dysfunction   . Syncope    Recurrent, second to Bradycardia  . Urinary urgency    With Incontinence  . Ventral hernia    Asymptomatic    Family History  Problem Relation Age of Onset  . Diabetes Mother   . Heart disease Mother     Pacemaker  . CAD Mother   . COPD Father   . Prostate cancer Father   . Pancreatic cancer Brother   . Other  Brother     CABG  . Heart failure Brother   . Coronary artery disease Brother     SOCIAL HISTORY: Social History  Substance Use Topics  . Smoking status: Never Smoker  . Smokeless tobacco: Never Used  . Alcohol use No    Allergies  Allergen Reactions  . Crestor [Rosuvastatin] Other (See Comments)    Myalgias  . Hygroton [Chlorthalidone] Other (See Comments)    Syncope  . Lipitor [Atorvastatin] Other (See Comments)    Dizziness  . Metamucil [Psyllium] Other (See Comments)    Syncope  . Mevacor [Lovastatin] Other (See Comments)    Muscle cramps  . Pravastatin Other (See Comments)    Myalgias  . Vytorin [Ezetimibe-Simvastatin] Other (See Comments)    Myalgias  . Zetia [Ezetimibe] Other (See Comments)    Myalgias  . Toviaz [Fesoterodine Fumarate Er] Rash    Current Outpatient Prescriptions  Medication Sig Dispense Refill  . amLODipine (NORVASC) 5 MG tablet     . Aspirin 81 MG EC tablet Take 40.5 mg by mouth every evening.     . finasteride (PROSCAR) 5 MG tablet     . Tamsulosin HCl (FLOMAX) 0.4 MG CAPS Take 0.4 mg by mouth every morning.     . valsartan-hydrochlorothiazide (DIOVAN-HCT) 160-25 MG per tablet Take 0.5 tablets by mouth daily.     . vitamin E (VITAMIN E) 400  UNIT capsule Take 400 Units by mouth every morning.     . vitamin k 100 MCG tablet Take 50 mcg by mouth once a week. Mondays    . nitroGLYCERIN (NITROSTAT) 0.4 MG SL tablet Place 0.4 mg under the tongue every 5 (five) minutes x 3 doses as needed. For chest pain     No current facility-administered medications for this visit.     REVIEW OF SYSTEMS:  [X]  denotes positive finding, [ ]  denotes negative finding Cardiac  Comments:  Chest pain or chest pressure:    Shortness of breath upon exertion:    Short of breath when lying flat:    Irregular heart rhythm:        Vascular    Pain in calf, thigh, or hip brought on by ambulation:    Pain in feet at night that wakes you up from your sleep:     Blood  clot in your veins:    Leg swelling:         Pulmonary    Oxygen at home:    Productive cough:     Wheezing:         Neurologic    Sudden weakness in arms or legs:     Sudden numbness in arms or legs:     Sudden onset of difficulty speaking or slurred speech:    Temporary loss of vision in one eye:     Problems with dizziness:         Gastrointestinal    Blood in stool:     Vomited blood:         Genitourinary    Burning when urinating:     Blood in urine:        Psychiatric    Major depression:         Hematologic    Bleeding problems:    Problems with blood clotting too easily:        Skin    Rashes or ulcers:        Constitutional    Fever or chills:      PHYSICAL EXAM: Vitals:   05/19/16 1414  BP: (!) 153/75  Pulse: 65  Temp: 97.9 F (36.6 C)  TempSrc: Oral  SpO2: 100%  Weight: 155 lb 4.8 oz (70.4 kg)  Height: 5\' 5"  (1.651 m)    GENERAL: The patient is a well-nourished male, in no acute distress. The vital signs are documented above. CARDIAC: There is a regular rate and rhythm.  PULMONARY: There is good air exchange bilaterally without wheezing or rales. MUSCULOSKELETAL: There are no major deformities or cyanosis. NEUROLOGIC: No focal weakness or paresthesias are detected. SKIN: There are no ulcers or rashes noted. PSYCHIATRIC: The patient has a normal affect. 2 DATA:  I have reviewed his CT scan with the following findings: No acute abnormality.  Abdominal aortic aneurysm seen on the prior examination has slightly decreased in size. Aortoiliac stent remains in place without complicating feature.  No change in 0.5 and 0.3 cm right lower lobe pulmonary nodules.  Diverticulosis without diverticulitis.  MEDICAL ISSUES: AAA: Successful exclusion of his aneurysm with no evidence of endoleak renal: I suspect the reason why they could not visualize the right kidney is that he has a large cyst and it may been difficult.  I think that this needs to  be definitively evaluated in the Cath Lab.  I will plan on a left brachial access and performing an abdominal aortogram with CO2 to confirm that  the left renal stent is widely patent and to make sure that the right renal artery is widely patent.  Should any degree of stenosis be identified, it would be intervened on.  This is been scheduled for Tuesday, December 12     [name], MD [facility] [phone] Pager [phone]

## 2016-05-20 ENCOUNTER — Other Ambulatory Visit: Payer: Self-pay

## 2016-05-27 ENCOUNTER — Ambulatory Visit (HOSPITAL_COMMUNITY)
Admission: RE | Admit: 2016-05-27 | Discharge: 2016-05-27 | Disposition: A | Discharge status: Home / Self Care | Payer: Medicare Other | Source: Ambulatory Visit | Attending: Surgery | Admitting: Surgery

## 2016-05-27 ENCOUNTER — Encounter (HOSPITAL_COMMUNITY)
Admission: RE | Disposition: A | Discharge status: Home / Self Care | Payer: Self-pay | Source: Ambulatory Visit | Attending: Surgery

## 2016-05-27 ENCOUNTER — Other Ambulatory Visit: Payer: Self-pay | Admitting: *Deleted

## 2016-05-27 DIAGNOSIS — Z8042 Family history of malignant neoplasm of prostate: Secondary | ICD-10-CM | POA: Insufficient documentation

## 2016-05-27 DIAGNOSIS — I701 Atherosclerosis of renal artery: Secondary | ICD-10-CM

## 2016-05-27 DIAGNOSIS — Z833 Family history of diabetes mellitus: Secondary | ICD-10-CM | POA: Insufficient documentation

## 2016-05-27 DIAGNOSIS — Z808 Family history of malignant neoplasm of other organs or systems: Secondary | ICD-10-CM | POA: Diagnosis not present

## 2016-05-27 DIAGNOSIS — Z86010 Personal history of colonic polyps: Secondary | ICD-10-CM | POA: Insufficient documentation

## 2016-05-27 DIAGNOSIS — N3941 Urge incontinence: Secondary | ICD-10-CM | POA: Insufficient documentation

## 2016-05-27 DIAGNOSIS — I10 Essential (primary) hypertension: Secondary | ICD-10-CM | POA: Insufficient documentation

## 2016-05-27 DIAGNOSIS — I2510 Atherosclerotic heart disease of native coronary artery without angina pectoris: Secondary | ICD-10-CM | POA: Diagnosis not present

## 2016-05-27 DIAGNOSIS — N400 Enlarged prostate without lower urinary tract symptoms: Secondary | ICD-10-CM | POA: Insufficient documentation

## 2016-05-27 DIAGNOSIS — Z950 Presence of cardiac pacemaker: Secondary | ICD-10-CM | POA: Diagnosis not present

## 2016-05-27 DIAGNOSIS — I714 Abdominal aortic aneurysm, without rupture, unspecified: Secondary | ICD-10-CM

## 2016-05-27 DIAGNOSIS — E785 Hyperlipidemia, unspecified: Secondary | ICD-10-CM | POA: Diagnosis not present

## 2016-05-27 DIAGNOSIS — Z7982 Long term (current) use of aspirin: Secondary | ICD-10-CM | POA: Diagnosis not present

## 2016-05-27 DIAGNOSIS — Z48812 Encounter for surgical aftercare following surgery on the circulatory system: Secondary | ICD-10-CM

## 2016-05-27 DIAGNOSIS — Z951 Presence of aortocoronary bypass graft: Secondary | ICD-10-CM | POA: Insufficient documentation

## 2016-05-27 DIAGNOSIS — I4891 Unspecified atrial fibrillation: Secondary | ICD-10-CM | POA: Diagnosis not present

## 2016-05-27 DIAGNOSIS — Z8249 Family history of ischemic heart disease and other diseases of the circulatory system: Secondary | ICD-10-CM | POA: Insufficient documentation

## 2016-05-27 DIAGNOSIS — R3129 Other microscopic hematuria: Secondary | ICD-10-CM | POA: Diagnosis not present

## 2016-05-27 HISTORY — PX: PERIPHERAL VASCULAR CATHETERIZATION: SHX172C

## 2016-05-27 LAB — POCT I-STAT, CHEM 8
BUN: 37 mg/dL — AB (ref 6–20)
CALCIUM ION: 1.11 mmol/L — AB (ref 1.15–1.40)
Chloride: 104 mmol/L (ref 101–111)
Creatinine, Ser: 2.8 mg/dL — ABNORMAL HIGH (ref 0.61–1.24)
Glucose, Bld: 95 mg/dL (ref 65–99)
HEMATOCRIT: 32 % — AB (ref 39.0–52.0)
HEMOGLOBIN: 10.9 g/dL — AB (ref 13.0–17.0)
Potassium: 3.5 mmol/L (ref 3.5–5.1)
SODIUM: 137 mmol/L (ref 135–145)
TCO2: 24 mmol/L (ref 0–100)

## 2016-05-27 LAB — POCT ACTIVATED CLOTTING TIME: ACTIVATED CLOTTING TIME: 158 s

## 2016-05-27 SURGERY — ABDOMINAL AORTOGRAM W/LOWER EXTREMITY

## 2016-05-27 MED ORDER — NITROGLYCERIN 1 MG/10 ML FOR IR/CATH LAB
INTRA_ARTERIAL | Status: AC
  Filled 2016-05-27: qty 10

## 2016-05-27 MED ORDER — LIDOCAINE HCL (PF) 1 % IJ SOLN
INTRAMUSCULAR | Status: AC
  Filled 2016-05-27: qty 30

## 2016-05-27 MED ORDER — SODIUM CHLORIDE 0.9 % IV SOLN
INTRAVENOUS | Status: DC
  Administered 2016-05-27: 08:00:00 via INTRAVENOUS

## 2016-05-27 MED ORDER — GUAIFENESIN-DM 100-10 MG/5ML PO SYRP
15.0000 mL | ORAL_SOLUTION | ORAL | Status: DC | PRN
  Filled 2016-05-27: qty 15

## 2016-05-27 MED ORDER — LIDOCAINE HCL (PF) 1 % IJ SOLN
INTRAMUSCULAR | Status: DC | PRN
  Administered 2016-05-27: 5 mL via INTRADERMAL

## 2016-05-27 MED ORDER — ACETAMINOPHEN 325 MG PO TABS
325.0000 mg | ORAL_TABLET | ORAL | Status: DC | PRN
  Filled 2016-05-27: qty 2

## 2016-05-27 MED ORDER — LABETALOL HCL 5 MG/ML IV SOLN: 10.0000 mg | INTRAVENOUS | Status: DC | PRN

## 2016-05-27 MED ORDER — HEPARIN SODIUM (PORCINE) 1000 UNIT/ML IJ SOLN
INTRAMUSCULAR | Status: DC | PRN
  Administered 2016-05-27: 2000 [IU] via INTRAVENOUS
  Administered 2016-05-27: 1000 [IU] via INTRAVENOUS

## 2016-05-27 MED ORDER — HEPARIN (PORCINE) IN NACL 2-0.9 UNIT/ML-% IJ SOLN
INTRAMUSCULAR | Status: DC | PRN
  Administered 2016-05-27: 1000 mL

## 2016-05-27 MED ORDER — FENTANYL CITRATE (PF) 100 MCG/2ML IJ SOLN
INTRAMUSCULAR | Status: DC | PRN
  Administered 2016-05-27 (×2): 25 ug via INTRAVENOUS

## 2016-05-27 MED ORDER — ALUM & MAG HYDROXIDE-SIMETH 200-200-20 MG/5ML PO SUSP
15.0000 mL | ORAL | Status: DC | PRN
  Filled 2016-05-27: qty 30

## 2016-05-27 MED ORDER — MIDAZOLAM HCL 2 MG/2ML IJ SOLN
INTRAMUSCULAR | Status: AC
  Filled 2016-05-27: qty 2

## 2016-05-27 MED ORDER — ONDANSETRON HCL 4 MG/2ML IJ SOLN
4.0000 mg | Freq: Four times a day (QID) | INTRAMUSCULAR | Status: DC | PRN
Start: 2016-05-27 — End: 2016-05-27

## 2016-05-27 MED ORDER — ACETAMINOPHEN 325 MG RE SUPP
325.0000 mg | RECTAL | Status: DC | PRN
  Filled 2016-05-27: qty 2

## 2016-05-27 MED ORDER — MIDAZOLAM HCL 2 MG/2ML IJ SOLN
INTRAMUSCULAR | Status: DC | PRN
  Administered 2016-05-27 (×2): 1 mg via INTRAVENOUS

## 2016-05-27 MED ORDER — PHENOL 1.4 % MT LIQD: 1.0000 | OROMUCOSAL | Status: DC | PRN

## 2016-05-27 MED ORDER — FENTANYL CITRATE (PF) 100 MCG/2ML IJ SOLN
INTRAMUSCULAR | Status: AC
  Filled 2016-05-27: qty 2

## 2016-05-27 MED ORDER — METOPROLOL TARTRATE 5 MG/5ML IV SOLN: 2.0000 mg | INTRAVENOUS | Status: DC | PRN

## 2016-05-27 MED ORDER — NITROGLYCERIN 1 MG/10 ML FOR IR/CATH LAB
INTRA_ARTERIAL | Status: DC | PRN
  Administered 2016-05-27: 200 ug via INTRA_ARTERIAL

## 2016-05-27 MED ORDER — SODIUM CHLORIDE 0.9 % IV SOLN: 1.0000 mL/kg/h | INTRAVENOUS | Status: DC

## 2016-05-27 MED ORDER — MORPHINE SULFATE (PF) 10 MG/ML IV SOLN: 2.0000 mg | INTRAVENOUS | Status: DC | PRN

## 2016-05-27 MED ORDER — HEPARIN (PORCINE) IN NACL 2-0.9 UNIT/ML-% IJ SOLN
INTRAMUSCULAR | Status: AC
  Filled 2016-05-27: qty 1000

## 2016-05-27 MED ORDER — HYDRALAZINE HCL 20 MG/ML IJ SOLN: 5.0000 mg | INTRAMUSCULAR | Status: DC | PRN

## 2016-05-27 MED ORDER — OXYCODONE HCL 5 MG PO TABS: 5.0000 mg | ORAL_TABLET | ORAL | Status: DC | PRN

## 2016-05-27 MED ORDER — DOCUSATE SODIUM 100 MG PO CAPS: 100.0000 mg | ORAL_CAPSULE | Freq: Every day | ORAL | Status: DC

## 2016-05-27 MED ORDER — HEPARIN SODIUM (PORCINE) 1000 UNIT/ML IJ SOLN
INTRAMUSCULAR | Status: AC
  Filled 2016-05-27: qty 1

## 2016-05-27 SURGICAL SUPPLY — 16 items
CATH ANGIO 5F BER 100CM (CATHETERS) ×1 IMPLANT
CATH ANGIO 5F BER2 100CM (CATHETERS) ×1 IMPLANT
CATH ANGIO 5F PIGTAIL 100CM (CATHETERS) ×1 IMPLANT
CATH INFINITI 5 FR AR2 MOD (CATHETERS) ×1 IMPLANT
CATH INFINITI JR4 5F (CATHETERS) ×1 IMPLANT
CATH SITESEER 5F MULTI A 2 (CATHETERS) ×1 IMPLANT
CATH SOS OMNI O 5F 80CM (CATHETERS) ×1 IMPLANT
FILTER CO2 0.2 MICRON (VASCULAR PRODUCTS) ×1 IMPLANT
KIT PV (KITS) ×2 IMPLANT
RESERVOIR CO2 (VASCULAR PRODUCTS) ×2 IMPLANT
SET FLUSH CO2 (MISCELLANEOUS) ×1 IMPLANT
SHEATH PINNACLE 5F 10CM (SHEATH) ×1 IMPLANT
TRANSDUCER W/STOPCOCK (MISCELLANEOUS) ×2 IMPLANT
TRAY PV CATH (CUSTOM PROCEDURE TRAY) ×2 IMPLANT
WIRE BENTSON .035X145CM (WIRE) ×1 IMPLANT
WIRE HI TORQ VERSACORE J 260CM (WIRE) ×1 IMPLANT

## 2016-05-27 NOTE — Discharge Instructions (Signed)
Angiogram, Care After °These instructions give you information about caring for yourself after your procedure. Your doctor may also give you more specific instructions. Call your doctor if you have any problems or questions after your procedure. °Follow these instructions at home: °· Take medicines only as told by your doctor. °· Follow your doctor's instructions about: °¨ Care of the area where the tube was inserted. °¨ Bandage (dressing) changes and removal. °· You may shower 24-48 hours after the procedure or as told by your doctor. °· Do not take baths, swim, or use a hot tub until your doctor approves. °· Every day, check the area where the tube was inserted. Watch for: °¨ Redness, swelling, or pain. °¨ Fluid, blood, or pus. °· Do not apply powder or lotion to the site. °· Do not lift anything that is heavier than 10 lb (4.5 kg) for 5 days or as told by your doctor. °· Ask your doctor when you can: °¨ Return to work or school. °¨ Do physical activities or play sports. °¨ Have sex. °· Do not drive or operate heavy machinery for 24 hours or as told by your doctor. °· Have someone with you for the first 24 hours after the procedure. °· Keep all follow-up visits as told by your doctor. This is important. °Contact a health care provider if: °· You have a fever. °· You have chills. °· You have more bleeding from the area where the tube was inserted. Hold pressure on the area. °· You have redness, swelling, or pain in the area where the tube was inserted. °· You have fluid or pus coming from the area. °Get help right away if: °· You have a lot of pain in the area where the tube was inserted. °· The area where the tube was inserted is bleeding, and the bleeding does not stop after 30 minutes of holding steady pressure on the area. °· The area near or just beyond the insertion site becomes pale, cool, tingly, or numb. °This information is not intended to replace advice given to you by your health care provider. Make  sure you discuss any questions you have with your health care provider. °Document Released: 08/29/2008 Document Revised: 11/08/2015 Document Reviewed: 11/03/2012 °Elsevier Interactive Patient Education © 2017 [employer] ° °

## 2016-05-27 NOTE — Progress Notes (Signed)
Dr Trula Slade notified of client passing out in bathroom and vital signs stable; color pale; awake and alert

## 2016-05-27 NOTE — Interval H&P Note (Signed)
History and Physical Interval Note:  05/27/2016 8:38 AM  Edward Cannon  has presented today for surgery, with the diagnosis of renal artery stenosis  The various methods of treatment have been discussed with the patient and family. After consideration of risks, benefits and other options for treatment, the patient has consented to  Procedure(s): Abdominal Aortogram w/Lower Extremity (N/A) as a surgical intervention .  The patient's history has been reviewed, patient examined, no change in status, stable for surgery.  I have reviewed the patient's chart and labs.  Questions were answered to the patient's satisfaction.     [name]

## 2016-05-27 NOTE — Progress Notes (Signed)
Client had bowel movement on bedpan and color pink after that

## 2016-05-27 NOTE — Progress Notes (Signed)
Client with hematoma noted left antecubital about 5x5cm and pressure held for 15 min and left antecubital site soft

## 2016-05-27 NOTE — Op Note (Signed)
    Patient name: Edward Cannon MRN: [id number] DOB: Dec 24, 1925 Sex: male  05/27/2016 Pre-operative Diagnosis: AAA Post-operative diagnosis:  Same Surgeon:  [name] Procedure Performed:  1.  Ultrasound-guided access, left brachial artery  2.  Conscious sedation (69 minutes)  3.  Abdominal aortogram with CO2    Indications:  The patient is status post fenestrated endovascular repair of his abdominal aortic aneurysm.  This included stenting of his left renal artery.  At his most recent ultrasound the right renal artery was not visualized.  His creatinine remains elevated but stable.  He comes in today for further evaluation  Procedure:  The patient was identified in the holding area and taken to room 8.  The patient was then placed supine on the table and prepped and draped in the usual sterile fashion.  A time out was called.  Conscious sedation was performed with the use of IV fentanyl and Versed under continuous physician and nurse monitoring.  Heart rate and blood pressure and oxygen saturations were continuously monitored.  Ultrasound was used to evaluate the left brachial artery.  It was patent and compressible.  1% lidocaine was used for local anesthesia.  The left brachial artery was then cannulated under ultrasound guidance with a micropuncture needle.  An 018 wire was advanced without resistance followed by insertion of a micropuncture sheath.  Next, an 035 wire was inserted and a 5 French sheath was inserted.  200 g of nitroglycerin into thousand units of heparin were administered through the sheath.  Next using a pigtail catheter and a versa core wire, the catheter was advanced into the abdominal aorta and positioned at the level of T12.  A CO2 injection was performed.  This was done with multiple oblique angles.  The celiac and superior mesenteric artery are patent.  On one image was able to confirm patency of the left renal stent.  The right renal artery was not visualized.  At  this point, I used multiple catheters in order to try to cannulate the right renal artery without success.  Ultimately it was presumed that the right renal artery was occluded   Impression:  #1  confirmed patency of the celiac, superior mesenteric, and left renal artery  #2  suspected occlusion of the right renal artery as it was not able to opacify with CO2, and I was unable to cannulate it with multiple catheters   V. [name], M.D. [facility] [place] Office: [phone] Pager:  [phone]

## 2016-05-27 NOTE — Progress Notes (Addendum)
Site area: LBA Site Prior to Removal:  Level 0 Pressure Applied For:25 min Manual:   min Patient Status During Pull:  stable Post Pull Site:  Level 0 Post Pull Instructions Given:  yes Post Pull Pulses Present: palpable Dressing Applied:  tegaderm Bedrest begins @ 1150 till 1550 Comments:

## 2016-05-27 NOTE — H&P (View-Only) (Signed)
[facility] [place]  Patient name: Edward Cannon MRN: [id number] DOB: 1926/03/17 Sex: male  REASON FOR VISIT: follow up  HPI: The patient is back today for follow-up. On 02/18/2015 he underwent a fenestrated endovascular repair of a juxtarenal abdominal aortic aneurysm including stenting of his left renal artery. This was done using a ZFEN device. The patient's only postoperative issue has been an increase in his renal function above his baseline. At the time of the operation he had a hemodynamically significant left renal artery stenosis which was stented. He reports no complaints today.  He most recently saw nephrology on 02/27/2016.  His creatinine at that time was 2.68.  I sent him for a CT scan to make sure his stent graft remained in good position, as the right kidney was not able to be visualized on ultrasound.  Past Medical History:  Diagnosis Date  . AAA (abdominal aortic aneurysm) ([place]) 10/04/14   Per  Dr. Viviana Simpler  . Abnormality of gait 10/09/2014  . Anginal pain ([place]) 2005  . Atrial fibrillation ([place])   . Bouchard nodes (DJD hand)   . BPH (benign prostatic hypertrophy)   . Coronary artery disease S/P CABG   . Diverticulosis    Wtih Anatomotic friability on colonscopy  . H/O scarlet fever 1936  . History of bronchitis winter 2012  . Hx of colonic polyps   . Hyperlipidemia   . Hypertension   . Leg weakness, bilateral 10/09/2014  . Microscopic hematuria   . 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 Pacemaker   . Sinus node dysfunction   . Syncope    Recurrent, second to Bradycardia  . Urinary urgency    With Incontinence  . Ventral hernia    Asymptomatic    Family History  Problem Relation Age of Onset  . Diabetes Mother   . Heart disease Mother     Pacemaker  . CAD Mother   . COPD Father   . Prostate cancer Father   . Pancreatic cancer Brother   . Other  Brother     CABG  . Heart failure Brother   . Coronary artery disease Brother     SOCIAL HISTORY: Social History  Substance Use Topics  . Smoking status: Never Smoker  . Smokeless tobacco: Never Used  . Alcohol use No    Allergies  Allergen Reactions  . Crestor [Rosuvastatin] Other (See Comments)    Myalgias  . Hygroton [Chlorthalidone] Other (See Comments)    Syncope  . Lipitor [Atorvastatin] Other (See Comments)    Dizziness  . Metamucil [Psyllium] Other (See Comments)    Syncope  . Mevacor [Lovastatin] Other (See Comments)    Muscle cramps  . Pravastatin Other (See Comments)    Myalgias  . Vytorin [Ezetimibe-Simvastatin] Other (See Comments)    Myalgias  . Zetia [Ezetimibe] Other (See Comments)    Myalgias  . Toviaz [Fesoterodine Fumarate Er] Rash    Current Outpatient Prescriptions  Medication Sig Dispense Refill  . amLODipine (NORVASC) 5 MG tablet     . Aspirin 81 MG EC tablet Take 40.5 mg by mouth every evening.     . finasteride (PROSCAR) 5 MG tablet     . Tamsulosin HCl (FLOMAX) 0.4 MG CAPS Take 0.4 mg by mouth every morning.     . valsartan-hydrochlorothiazide (DIOVAN-HCT) 160-25 MG per tablet Take 0.5 tablets by mouth daily.     . vitamin E (VITAMIN E) 400  UNIT capsule Take 400 Units by mouth every morning.     . vitamin k 100 MCG tablet Take 50 mcg by mouth once a week. Mondays    . nitroGLYCERIN (NITROSTAT) 0.4 MG SL tablet Place 0.4 mg under the tongue every 5 (five) minutes x 3 doses as needed. For chest pain     No current facility-administered medications for this visit.     REVIEW OF SYSTEMS:  [X]  denotes positive finding, [ ]  denotes negative finding Cardiac  Comments:  Chest pain or chest pressure:    Shortness of breath upon exertion:    Short of breath when lying flat:    Irregular heart rhythm:        Vascular    Pain in calf, thigh, or hip brought on by ambulation:    Pain in feet at night that wakes you up from your sleep:     Blood  clot in your veins:    Leg swelling:         Pulmonary    Oxygen at home:    Productive cough:     Wheezing:         Neurologic    Sudden weakness in arms or legs:     Sudden numbness in arms or legs:     Sudden onset of difficulty speaking or slurred speech:    Temporary loss of vision in one eye:     Problems with dizziness:         Gastrointestinal    Blood in stool:     Vomited blood:         Genitourinary    Burning when urinating:     Blood in urine:        Psychiatric    Major depression:         Hematologic    Bleeding problems:    Problems with blood clotting too easily:        Skin    Rashes or ulcers:        Constitutional    Fever or chills:      PHYSICAL EXAM: Vitals:   05/19/16 1414  BP: (!) 153/75  Pulse: 65  Temp: 97.9 F (36.6 C)  TempSrc: Oral  SpO2: 100%  Weight: 155 lb 4.8 oz (70.4 kg)  Height: 5\' 5"  (1.651 m)    GENERAL: The patient is a well-nourished male, in no acute distress. The vital signs are documented above. CARDIAC: There is a regular rate and rhythm.  PULMONARY: There is good air exchange bilaterally without wheezing or rales. MUSCULOSKELETAL: There are no major deformities or cyanosis. NEUROLOGIC: No focal weakness or paresthesias are detected. SKIN: There are no ulcers or rashes noted. PSYCHIATRIC: The patient has a normal affect. 2 DATA:  I have reviewed his CT scan with the following findings: No acute abnormality.  Abdominal aortic aneurysm seen on the prior examination has slightly decreased in size. Aortoiliac stent remains in place without complicating feature.  No change in 0.5 and 0.3 cm right lower lobe pulmonary nodules.  Diverticulosis without diverticulitis.  MEDICAL ISSUES: AAA: Successful exclusion of his aneurysm with no evidence of endoleak renal: I suspect the reason why they could not visualize the right kidney is that he has a large cyst and it may been difficult.  I think that this needs to  be definitively evaluated in the Cath Lab.  I will plan on a left brachial access and performing an abdominal aortogram with CO2 to confirm that  the left renal stent is widely patent and to make sure that the right renal artery is widely patent.  Should any degree of stenosis be identified, it would be intervened on.  This is been scheduled for Tuesday, December 12     [name], MD [facility] [phone] Pager [phone]

## 2016-05-27 NOTE — Progress Notes (Signed)
Dr Trula Slade notified client without any complaints and vital signs stable and per Dr Trula Slade ok to discharge home

## 2016-05-27 NOTE — Progress Notes (Signed)
Edward Cannon NT had pt to the restroom. [name] called out stating pt is passing out. Several staff members came to assess the situation. Pt was slumped forward with his wife holding him up. Pam Ishmael Holter rn and Benita Gutter rn relieved the wife then I arrived with a wheel chair. Pt was transferred to the wheelchair without incident and taken to a stretcher and placed in trendelenburg position, pt was pale, diaphoretic and was speaking to [place]. VVS were stable. Report was given to his nurse Flavia Shipper Rn and she took over at this point. Pt did loose consciousness but never hit the floor or anything else, pt was supported upright till able to get to the stretcher.

## 2016-05-28 ENCOUNTER — Encounter (HOSPITAL_COMMUNITY): Payer: Self-pay | Admitting: Surgery

## 2016-06-30 ENCOUNTER — Other Ambulatory Visit: Payer: Self-pay

## 2016-06-30 DIAGNOSIS — I714 Abdominal aortic aneurysm, without rupture, unspecified: Secondary | ICD-10-CM

## 2016-07-01 ENCOUNTER — Ambulatory Visit (INDEPENDENT_AMBULATORY_CARE_PROVIDER_SITE_OTHER): Payer: Medicare Other | Admitting: *Deleted

## 2016-07-01 ENCOUNTER — Telehealth: Payer: Self-pay | Admitting: Cardiology

## 2016-07-01 DIAGNOSIS — I495 Sick sinus syndrome: Secondary | ICD-10-CM

## 2016-07-01 NOTE — Telephone Encounter (Signed)
Spoke with pt and reminded pt of remote transmission that is due today. Pt verbalized understanding.   

## 2016-07-04 NOTE — Progress Notes (Signed)
Remote pacemaker transmission.   

## 2016-07-05 LAB — CUP PACEART REMOTE DEVICE CHECK
Battery Remaining Longevity: 105 mo
Battery Remaining Percentage: 81 %
Battery Voltage: 2.93 V
Brady Statistic AP VP Percent: 1 %
Brady Statistic AP VS Percent: 97 %
Brady Statistic AS VS Percent: 3 %
Brady Statistic RV Percent Paced: 1 %
Implantable Lead Implant Date: 20130708
Implantable Lead Implant Date: 20130708
Implantable Lead Location: 753859
Implantable Lead Model: 1948
Implantable Pulse Generator Implant Date: 20130708
Lead Channel Impedance Value: 410 Ohm
Lead Channel Impedance Value: 550 Ohm
Lead Channel Pacing Threshold Amplitude: 0.625 V
Lead Channel Sensing Intrinsic Amplitude: 3.6 mV
Lead Channel Setting Sensing Sensitivity: 2 mV
MDC IDC LEAD LOCATION: 753860
MDC IDC MSMT LEADCHNL RA PACING THRESHOLD PULSEWIDTH: 0.4 ms
MDC IDC MSMT LEADCHNL RV PACING THRESHOLD AMPLITUDE: 1.125 V
MDC IDC MSMT LEADCHNL RV PACING THRESHOLD PULSEWIDTH: 0.6 ms
MDC IDC MSMT LEADCHNL RV SENSING INTR AMPL: 9.7 mV
MDC IDC PG SERIAL: 7350984
MDC IDC SESS DTM: 20180117013541
MDC IDC SET LEADCHNL RA PACING AMPLITUDE: 1.625
MDC IDC SET LEADCHNL RV PACING AMPLITUDE: 1.375
MDC IDC SET LEADCHNL RV PACING PULSEWIDTH: 0.6 ms
MDC IDC STAT BRADY AS VP PERCENT: 1 %
MDC IDC STAT BRADY RA PERCENT PACED: 97 %

## 2016-07-09 ENCOUNTER — Encounter: Payer: Self-pay | Admitting: Cardiology

## 2016-07-22 DIAGNOSIS — I10 Essential (primary) hypertension: Secondary | ICD-10-CM | POA: Diagnosis not present

## 2016-07-22 DIAGNOSIS — D472 Monoclonal gammopathy: Secondary | ICD-10-CM | POA: Diagnosis not present

## 2016-07-22 DIAGNOSIS — N184 Chronic kidney disease, stage 4 (severe): Secondary | ICD-10-CM | POA: Diagnosis not present

## 2016-07-22 DIAGNOSIS — D631 Anemia in chronic kidney disease: Secondary | ICD-10-CM | POA: Diagnosis not present

## 2016-07-31 DIAGNOSIS — H353131 Nonexudative age-related macular degeneration, bilateral, early dry stage: Secondary | ICD-10-CM | POA: Diagnosis not present

## 2016-08-29 ENCOUNTER — Ambulatory Visit (INDEPENDENT_AMBULATORY_CARE_PROVIDER_SITE_OTHER): Payer: Medicare Other | Admitting: Interventional Cardiology

## 2016-08-29 ENCOUNTER — Encounter (INDEPENDENT_AMBULATORY_CARE_PROVIDER_SITE_OTHER): Payer: Self-pay

## 2016-08-29 ENCOUNTER — Encounter: Payer: Self-pay | Admitting: Interventional Cardiology

## 2016-08-29 VITALS — BP 154/84 | HR 67 | Ht 65.0 in | Wt 157.8 lb

## 2016-08-29 DIAGNOSIS — I714 Abdominal aortic aneurysm, without rupture, unspecified: Secondary | ICD-10-CM

## 2016-08-29 DIAGNOSIS — I25810 Atherosclerosis of coronary artery bypass graft(s) without angina pectoris: Secondary | ICD-10-CM | POA: Diagnosis not present

## 2016-08-29 DIAGNOSIS — R55 Syncope and collapse: Secondary | ICD-10-CM

## 2016-08-29 DIAGNOSIS — I480 Paroxysmal atrial fibrillation: Secondary | ICD-10-CM | POA: Diagnosis not present

## 2016-08-29 DIAGNOSIS — I495 Sick sinus syndrome: Secondary | ICD-10-CM

## 2016-08-29 NOTE — Progress Notes (Signed)
Cardiology Office Note    Date:  08/29/2016   ID:  Edward PERREIRA, DOB Feb 12, 1926, MRN [id number]  PCP:  Henrine Screws, MD  Cardiologist: Sinclair Grooms, MD   Chief Complaint  Patient presents with  . Coronary Artery Disease    History of Present Illness:  Edward Cannon is a 83 y.o. male followup for a pacemaker implanted 7/13 for sinus node dysfunction in the setting of ischemic heart disease with prior bypass surgeryRIMA to LAD, LIMA to OM, and SVG to PDA, and normal left ventricular function. He also has a history of atrial fibrillation. Abdominal aortic aneurysm status post and oh repair 2016. Stage IV chronic kidney disease.  No vascular complaints. Denies chest discomfort. Denies orthopnea and PND. He has not had recent syncope although over the years he has had episodes prior to permanent pacemaker implantation for sinus node dysfunction and high grade AV block. He denies lower extremity swelling. oza syncope.   Past Medical History:  Diagnosis Date  . AAA (abdominal aortic aneurysm) ([place]) 10/04/14   Per  Dr. Viviana Simpler  . Abnormality of gait 10/09/2014  . Anginal pain ([place]) 2005  . Atrial fibrillation ([place])   . Bouchard nodes (DJD hand)   . BPH (benign prostatic hypertrophy)   . Coronary artery disease S/P CABG   . Diverticulosis    Wtih Anatomotic friability on colonscopy  . H/O scarlet fever 1936  . History of bronchitis winter 2012  . Hx of colonic polyps   . Hyperlipidemia   . Hypertension   . Leg weakness, bilateral 10/09/2014  . Microscopic hematuria   . 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 Pacemaker   . Sinus node dysfunction   . Syncope    Recurrent, second to Bradycardia  . Urinary urgency    With Incontinence  . Ventral hernia    Asymptomatic    Past Surgical History:  Procedure Laterality Date  . ABDOMINAL AORTIC ENDOVASCULAR FENESTRATED STENT GRAFT N/A 02/15/2015   Procedure: ABDOMINAL AORTIC  ENDOVASCULAR FENESTRATED STENT GRAFT;  Surgeon: Serafina Mitchell, MD;  Location: [facility];  Service: Vascular;  Laterality: N/A;  . CARDIAC CATHETERIZATION    . CATARACT EXTRACTION Bilateral   . COLON RESECTION  ~ 2005  . COLON SURGERY    . CORONARY ARTERY BYPASS GRAFT  ~ 2009   CABG X3  . EYE SURGERY Bilateral 2012  . INSERT / REPLACE / REMOVE PACEMAKER  12/22/11  . LIMA     Obtuse marginal branch of the left circumflex   . LOW ANTERIOR BOWEL RESECTION     for large villous  . MASTOIDECTOMY  1930  . PERIPHERAL VASCULAR CATHETERIZATION N/A 05/27/2016   Procedure: Abdominal Aortogram w/Lower Extremity;  Surgeon: Serafina Mitchell, MD;  Location: [facility];  Service: Cardiovascular;  Laterality: N/A;  . PERMANENT PACEMAKER INSERTION N/A 12/22/2011   Procedure: PERMANENT PACEMAKER INSERTION;  Surgeon: Deboraha Sprang, MD;  Location[facility] C[facility];  Service: Cardiovascular;  Laterality: N/A;  . RENAL ARTERY STENT Left 02/15/2015   Stenting and angioplasty of left renal artery  . RIMA     to the LAD  . SVG  2005   to posterior lateral branch of the RCA    Current Medications: Outpatient Medications Prior to Visit  Medication Sig Dispense Refill  . amLODipine (NORVASC) 5 MG tablet Take 5 mg by mouth daily.     . Aspirin 81 MG  EC tablet Take 40.5 mg by mouth every evening.     . cholecalciferol (VITAMIN D) 1000 units tablet Take 1,000 Units by mouth daily.    . finasteride (PROSCAR) 5 MG tablet Take 5 mg by mouth daily.     . nitroGLYCERIN (NITROSTAT) 0.4 MG SL tablet Place 0.4 mg under the tongue every 5 (five) minutes x 3 doses as needed. For chest pain    . Omega-3 Fatty Acids (FISH OIL) 1000 MG CAPS Take 1,000 mg by mouth daily.    . Tamsulosin HCl (FLOMAX) 0.4 MG CAPS Take 0.4 mg by mouth daily.     . valsartan-hydrochlorothiazide (DIOVAN-HCT) 160-25 MG per tablet Take 0.5 tablets by mouth daily.     . vitamin E (VITAMIN E) 400 UNIT capsule Take 400 Units by mouth every morning.     .  vitamin k 100 MCG tablet Take 50 mcg by mouth every Monday.      No facility-administered medications prior to visit.      Allergies:   Crestor [rosuvastatin]; Hygroton [chlorthalidone]; Lipitor [atorvastatin]; Metamucil [psyllium]; Mevacor [lovastatin]; Pravastatin; Vytorin [ezetimibe-simvastatin]; Zetia [ezetimibe]; and Toviaz [fesoterodine fumarate er]   Social History   Social History  . Marital status: Married    Spouse name: N/A  . Number of children: 1  . Years of education: PhD   Occupational History  . retired    Social History Main Topics  . Smoking status: Never Smoker  . Smokeless tobacco: Never Used  . Alcohol use No  . Drug use: No  . Sexual activity: No   Other Topics Concern  . None   Social History Narrative   Patient is right handed.   Patient does not drink caffeine.     Family History:  The patient's family history includes CAD in his mother; COPD in his father; Coronary artery disease in his brother; Diabetes in his mother; Heart disease in his mother; Heart failure in his brother; Other in his brother; Pancreatic cancer in his brother; Prostate cancer in his father.   ROS:   Please see the history of present illness.    Feels dizzy after he eats. He does any moderate activity he has dizziness.  All other systems reviewed and are negative.   PHYSICAL EXAM:   VS:  BP (!) 154/84 (BP Location: Left Arm)   Pulse 67   Ht 5\' 5"  (1.651 m)   Wt 157 lb 12.8 oz (71.6 kg)   BMI 26.26 kg/m    GEN: Well nourished, well developed, in no acute distress  HEENT: normal  Neck: no JVD, carotid bruits, or masses Cardiac: RRR; no murmurs, rubs, or gallops,no edema  Respiratory:  clear to auscultation bilaterally, normal work of breathing GI: soft, nontender, nondistended, + BS MS: no deformity or atrophy  Skin: warm and dry, no rash Neuro:  Alert and Oriented x 3, Strength and sensation are intact Psych: euthymic mood, full affect  Wt Readings from Last 3  Encounters:  08/29/16 157 lb 12.8 oz (71.6 kg)  05/27/16 146 lb (66.2 kg)  05/19/16 155 lb 4.8 oz (70.4 kg)      Studies/Labs Reviewed:   EKG:  EKG  Not performed today. When performed in October he had incomplete right bundle with left axis deviation. No change compared to prior tracings.  Recent Labs: 05/27/2016: BUN 37; Creatinine, Ser 2.80; Hemoglobin 10.9; Potassium 3.5; Sodium 137   Lipid Panel No results found for: CHOL, TRIG, HDL, CHOLHDL, VLDL, LDLCALC, LDLDIRECT  Additional studies/  records that were reviewed today include:  Nuclear stress study May 2016: Study Highlights   Addendum by Dorothy Spark, MD on Mon Feb 12, 2015 8:50 AM   There is no resting or stress perfusion defect.   This study is normal, there is no evidence of prior scar or ischemia.       ASSESSMENT:    1. CAD of autologous artery bypass graft without angina   2. Paroxysmal atrial fibrillation (HCC)   3. Abdominal aortic aneurysm (AAA) without rupture ([place])   4. Sinus node dysfunction (HCC)   5. Syncope, unspecified syncope type      PLAN:  In order of problems listed above:  1. No angina or ischemic equivalent symptoms. Plan simple clinical observation at his current age. 2. No clinical recurrences. Also plan clinical observation. 3. Followed by Dr. Trula Slade. Recent documentation of patency of endoprosthesis. No evidence of side branch occlusion or leak. 4. Normally functioning DDD pacemaker. 5. Prior syncope with no recent recurrences.  Plan conservative clinical follow-up. We discussed his driving. If these episodes of dizziness become more severe, I have advised that he consider getting a chauffeur. One-year clinical follow-up.  Medication Adjustments/Labs and Tests Ordered: Current medicines are reviewed at length with the patient today.  Concerns regarding medicines are outlined above.  Medication changes, Labs and Tests ordered today are listed in the Patient Instructions  below. Patient Instructions  Medication Instructions:  None  Labwork: None  Testing/Procedures: None  Follow-Up: Your physician wants you to follow-up in: 1 year with Dr. Tamala Julian.  You will receive a reminder letter in the mail two months in advance. If you don't receive a letter, please call our office to schedule the follow-up appointment.   Any Other Special Instructions Will Be Listed Below (If Applicable).     If you need a refill on your cardiac medications before your next appointment, please call your pharmacy.      Signed, Sinclair Grooms, MD  08/29/2016 12:24 PM    [facility] [place], [place], [place]  [address] Phone: [phone]; Fax: [phone]

## 2016-08-29 NOTE — Patient Instructions (Signed)

## 2016-09-30 ENCOUNTER — Telehealth: Payer: Self-pay | Admitting: Cardiology

## 2016-09-30 ENCOUNTER — Ambulatory Visit (INDEPENDENT_AMBULATORY_CARE_PROVIDER_SITE_OTHER): Payer: Medicare Other | Admitting: *Deleted

## 2016-09-30 DIAGNOSIS — I495 Sick sinus syndrome: Secondary | ICD-10-CM | POA: Diagnosis not present

## 2016-09-30 NOTE — Telephone Encounter (Signed)
Attempted to confirm remote transmission with pt. No answer and was unable to leave a message.   

## 2016-10-01 NOTE — Progress Notes (Signed)
Remote pacemaker transmission.   

## 2016-10-02 LAB — CUP PACEART REMOTE DEVICE CHECK
Battery Remaining Longevity: 105 mo
Battery Remaining Percentage: 81 %
Battery Voltage: 2.93 V
Brady Statistic AS VS Percent: 3.3 %
Implantable Lead Implant Date: 20130708
Implantable Lead Location: 753860
Implantable Pulse Generator Implant Date: 20130708
Lead Channel Impedance Value: 410 Ohm
Lead Channel Pacing Threshold Amplitude: 0.625 V
Lead Channel Pacing Threshold Pulse Width: 0.6 ms
Lead Channel Sensing Intrinsic Amplitude: 4 mV
Lead Channel Sensing Intrinsic Amplitude: 9.4 mV
Lead Channel Setting Pacing Amplitude: 1.375
MDC IDC LEAD IMPLANT DT: 20130708
MDC IDC LEAD LOCATION: 753859
MDC IDC MSMT LEADCHNL RA PACING THRESHOLD PULSEWIDTH: 0.4 ms
MDC IDC MSMT LEADCHNL RV IMPEDANCE VALUE: 550 Ohm
MDC IDC MSMT LEADCHNL RV PACING THRESHOLD AMPLITUDE: 1.125 V
MDC IDC PG SERIAL: 7350984
MDC IDC SESS DTM: 20180417204914
MDC IDC SET LEADCHNL RA PACING AMPLITUDE: 1.625
MDC IDC SET LEADCHNL RV PACING PULSEWIDTH: 0.6 ms
MDC IDC SET LEADCHNL RV SENSING SENSITIVITY: 2 mV
MDC IDC STAT BRADY AP VP PERCENT: 1 %
MDC IDC STAT BRADY AP VS PERCENT: 96 %
MDC IDC STAT BRADY AS VP PERCENT: 1 %
MDC IDC STAT BRADY RA PERCENT PACED: 96 %
MDC IDC STAT BRADY RV PERCENT PACED: 1 %

## 2016-10-03 ENCOUNTER — Encounter: Payer: Self-pay | Admitting: Cardiology

## 2016-10-08 ENCOUNTER — Encounter: Payer: Self-pay | Admitting: Surgery

## 2016-10-11 IMAGING — US US RENAL
1 series · 14 of 25 positions shown · non-contrast
Comparison: CT scan of March 19, 2015.

CLINICAL DATA: Renal insufficiency.

EXAM:
RENAL / URINARY TRACT ULTRASOUND COMPLETE

[Series 1: us renal · 0.32mm/px · 14 of 47 slices shown]
[im 1/47]
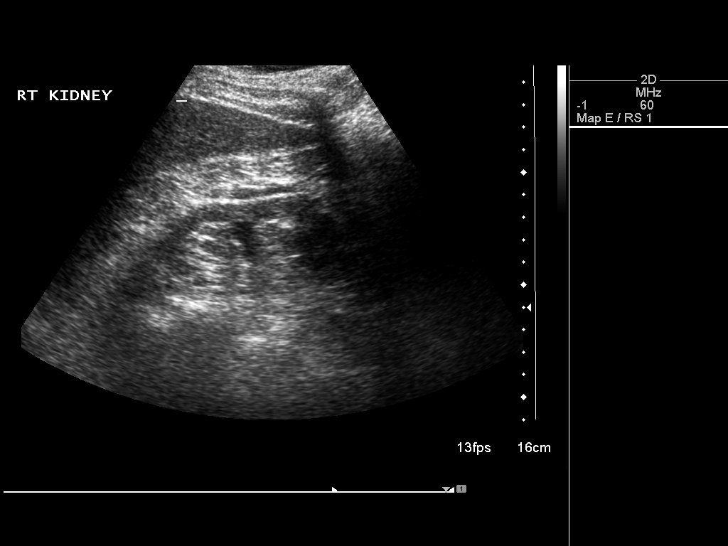
[im 4/47]
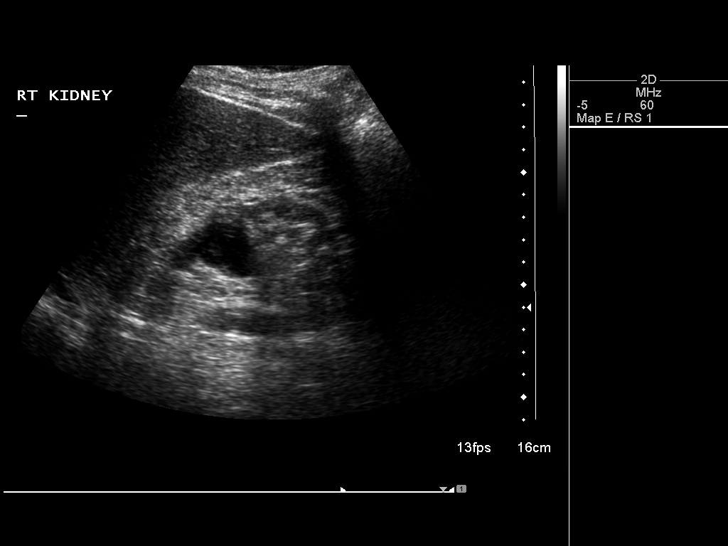
[im 8/47]
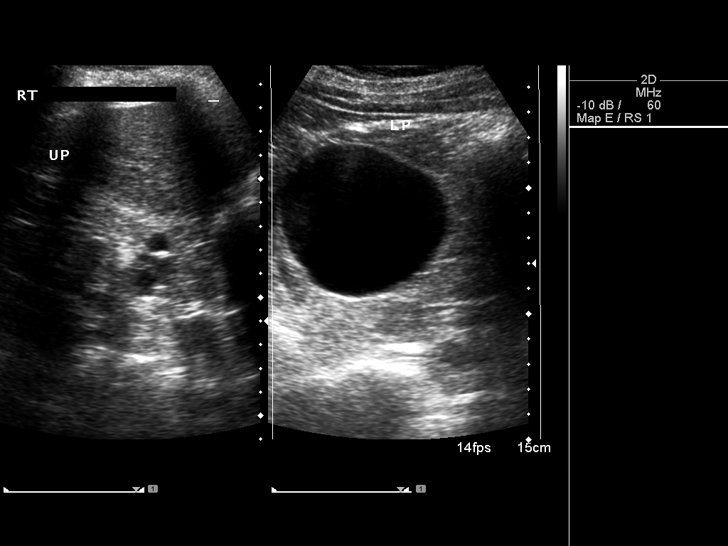
[im 12/47]
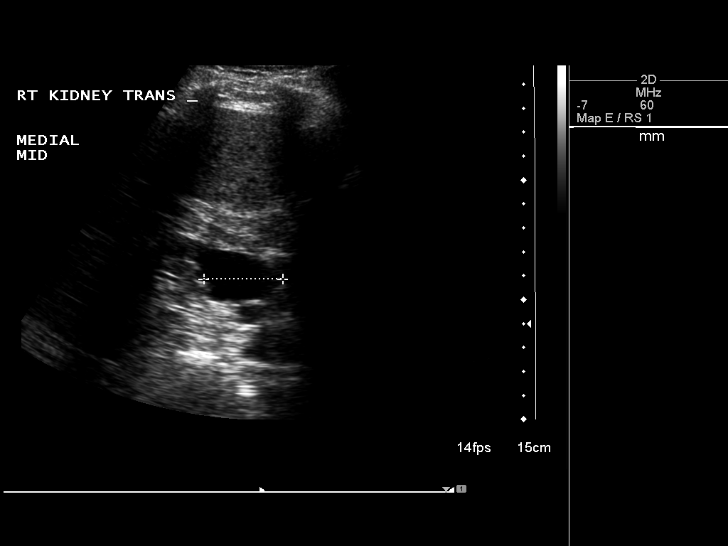
[im 16/47]
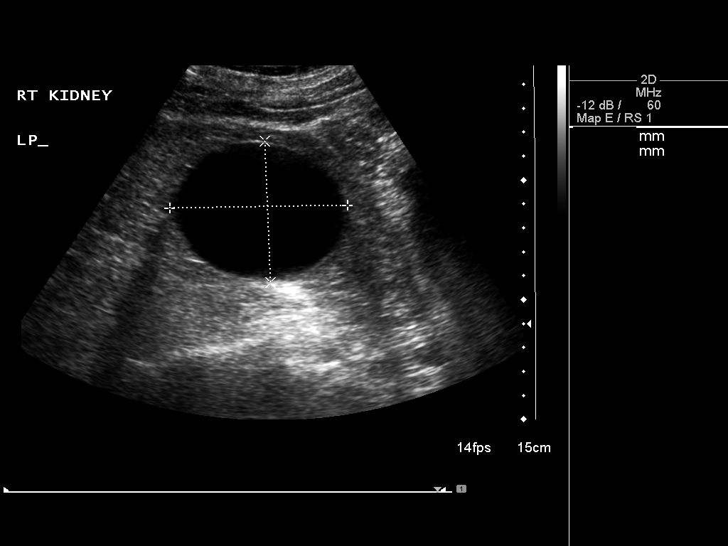
[im 18/47]
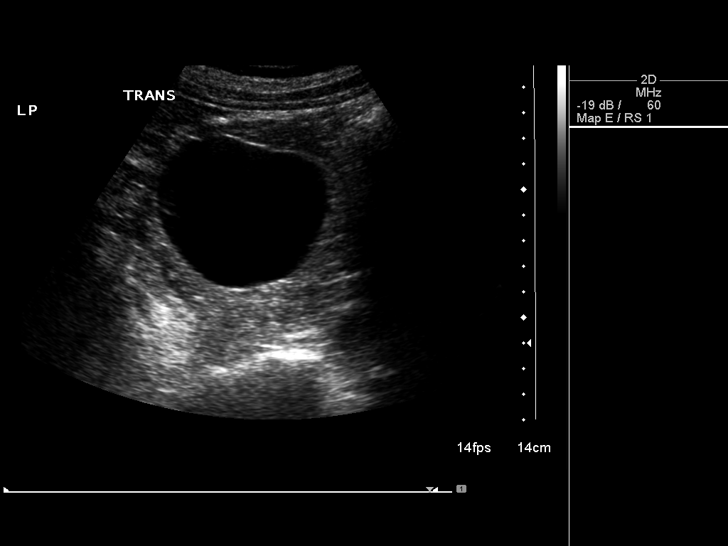
[im 22/47]
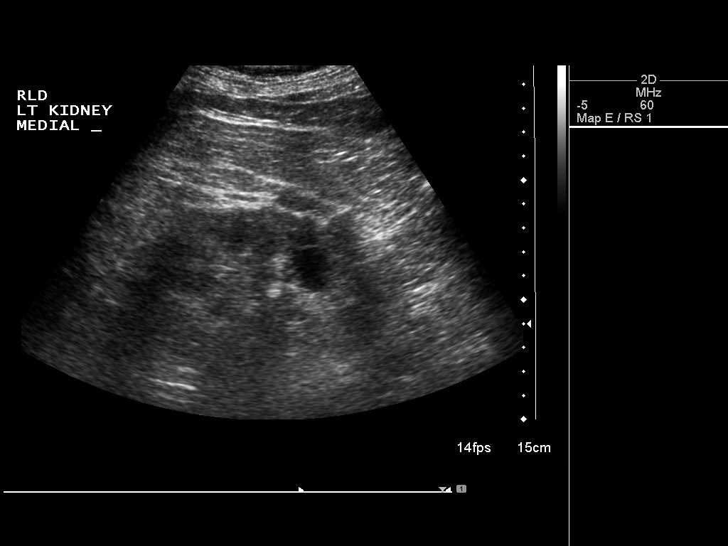
[im 25/47]
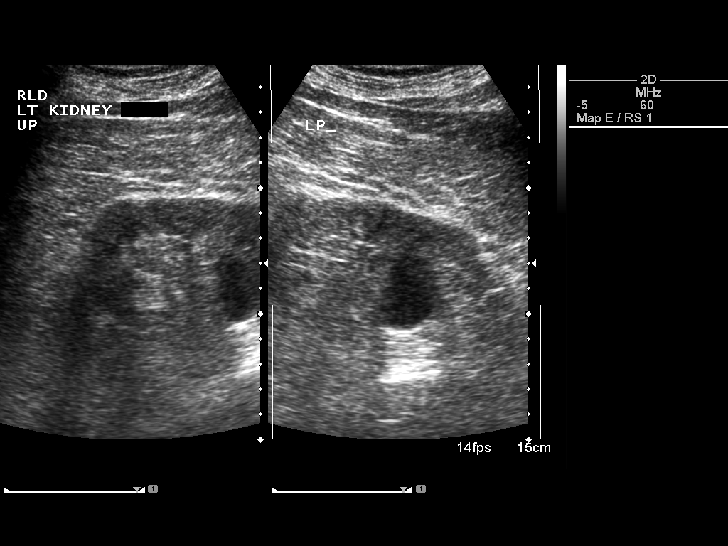
[im 29/47]
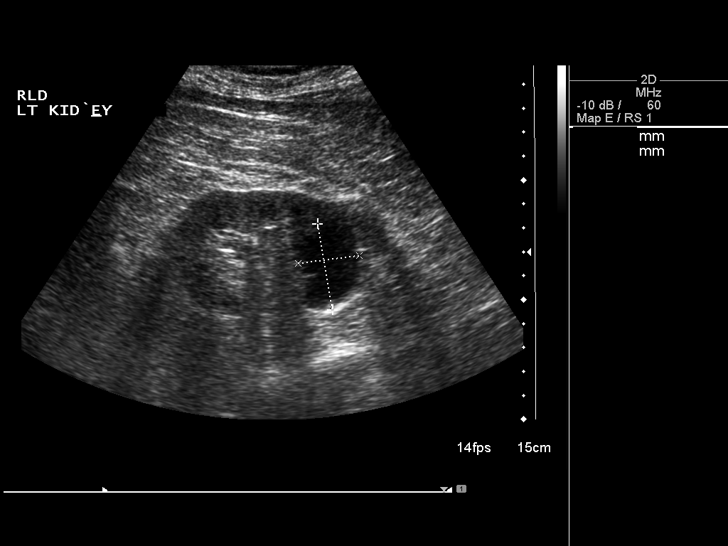
[im 31/47]
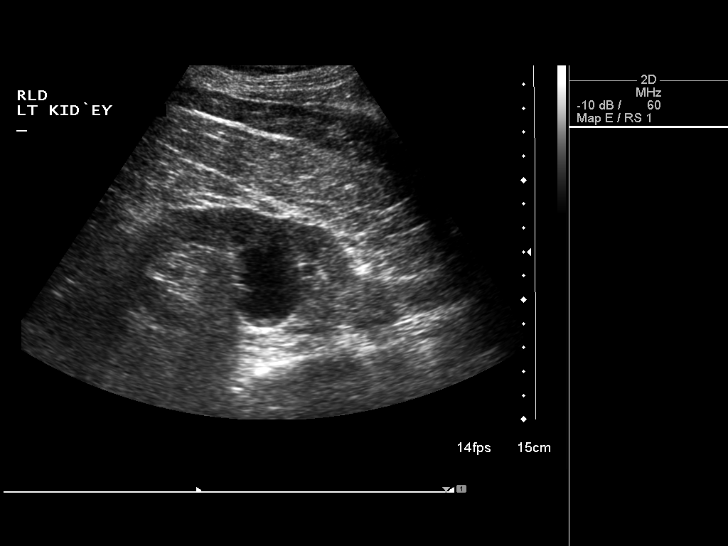
[im 35/47]
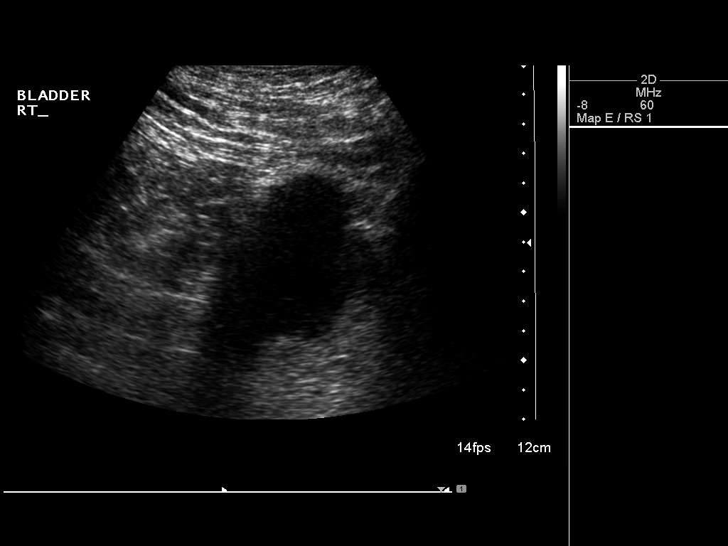
[im 39/47]
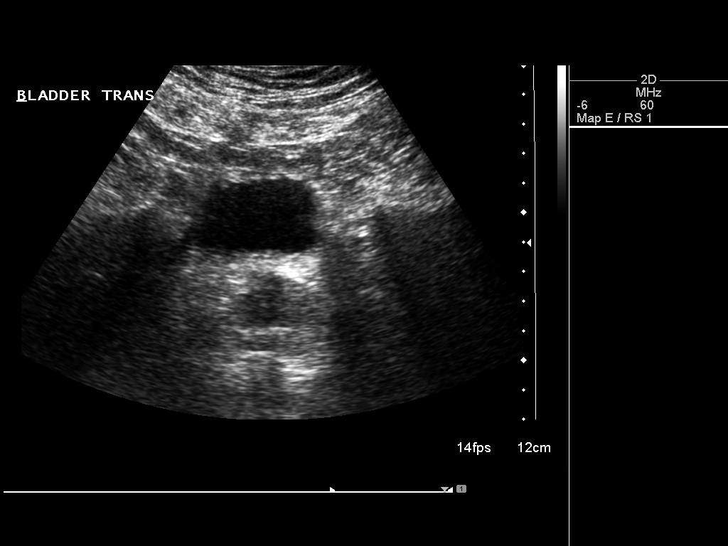
[im 43/47]
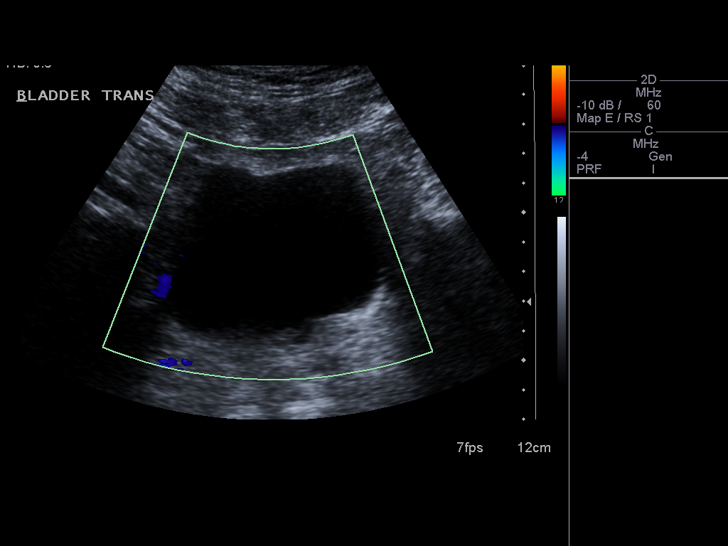
[im 47/47]
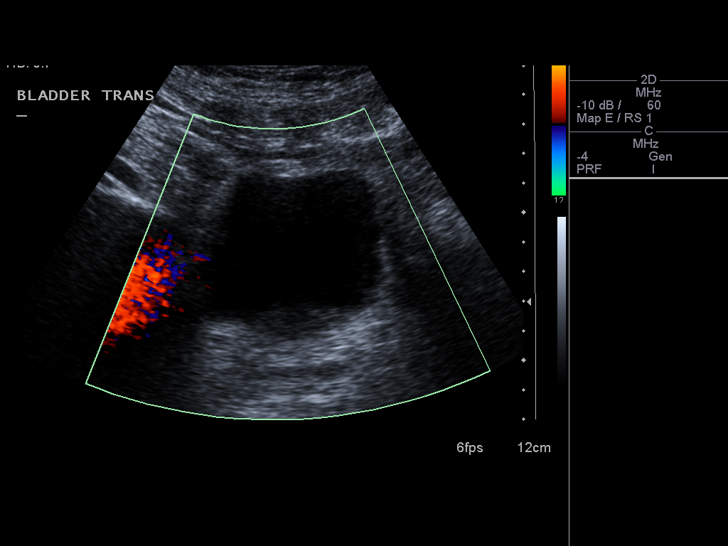

[14 of 25 positions shown; findings below may reference images not displayed]

FINDINGS: Right Kidney:

Length: 9.6 cm. 7.4 cm simple cyst is seen in lower pole. 3.7 cm
simple cyst is seen in midpole. Echogenicity within normal limits.
No mass or hydronephrosis visualized.

Left Kidney:

Length: 10.4 cm. 3.7 cm simple cyst is seen in midpole. Echogenicity
within normal limits. No mass or hydronephrosis visualized.

Bladder:

Appears normal for degree of bladder distention. Left ureteral jet
is visualized. Right jet is not visualized.
IMPRESSION: Bilateral simple renal cysts. No other significant renal abnormality
seen.

## 2016-10-14 DIAGNOSIS — N184 Chronic kidney disease, stage 4 (severe): Secondary | ICD-10-CM | POA: Diagnosis not present

## 2016-10-20 ENCOUNTER — Encounter: Payer: Self-pay | Admitting: Surgery

## 2016-10-20 ENCOUNTER — Ambulatory Visit (HOSPITAL_COMMUNITY)
Admission: RE | Admit: 2016-10-20 | Discharge: 2016-10-20 | Disposition: A | Discharge status: Home / Self Care | Payer: Medicare Other | Source: Ambulatory Visit | Attending: Surgery | Admitting: Surgery

## 2016-10-20 ENCOUNTER — Ambulatory Visit (INDEPENDENT_AMBULATORY_CARE_PROVIDER_SITE_OTHER): Payer: Medicare Other | Admitting: Surgery

## 2016-10-20 VITALS — BP 154/76 | HR 65 | Temp 97.7°F | Resp 18 | Ht 65.0 in | Wt 154.3 lb

## 2016-10-20 DIAGNOSIS — I701 Atherosclerosis of renal artery: Secondary | ICD-10-CM

## 2016-10-20 DIAGNOSIS — Z9889 Other specified postprocedural states: Secondary | ICD-10-CM | POA: Diagnosis not present

## 2016-10-20 DIAGNOSIS — Z95828 Presence of other vascular implants and grafts: Secondary | ICD-10-CM | POA: Diagnosis not present

## 2016-10-20 DIAGNOSIS — I714 Abdominal aortic aneurysm, without rupture, unspecified: Secondary | ICD-10-CM

## 2016-10-20 DIAGNOSIS — Z48812 Encounter for surgical aftercare following surgery on the circulatory system: Secondary | ICD-10-CM | POA: Insufficient documentation

## 2016-10-20 NOTE — Progress Notes (Signed)
[facility] and [facility]in Specialist of [place]  Patient name: Edward Cannon MRN: [id number] DOB: 04/09/1926 Sex: male   REASON FOR VISIT:    Follow up [place]:    The patient is back today for follow-up. On 02/18/2015 he underwent a fenestrated endovascular repair of a juxtarenal abdominal aortic aneurysm including stenting of his left renal artery. This was done using a ZFEN device. The patient's only postoperative issue has been an increase in his renal function above his baseline. At the time of the operation he had a hemodynamically significant left renal artery stenosis which was stented.  In December 2017, his right kidney was not visualized on ultrasound.  Therefore on 05/27/2016, he went for angiography with CO2.  This confirmed occlusion of the right renal artery.  He has no complaints today.  He does state that he is voiding frequently.  His most recent nephrology note from February 2018 shows a creatinine of 2.9 which is stable.   PAST MEDICAL HISTORY:   Past Medical History:  Diagnosis Date  . AAA (abdominal aortic aneurysm) ([place]) 10/04/14   Per  Dr. Viviana Simpler  . Abnormality of gait 10/09/2014  . Anginal pain ([place]) 2005  . Atrial fibrillation ([place])   . Bouchard nodes (DJD hand)   . BPH (benign prostatic hypertrophy)   . Coronary artery disease S/P CABG   . Diverticulosis    Wtih Anatomotic friability on colonscopy  . H/O scarlet fever 1936  . History of bronchitis winter 2012  . Hx of colonic polyps   . Hyperlipidemia   . Hypertension   . Leg weakness, bilateral 10/09/2014  . Microscopic hematuria   . 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 Pacemaker   . Sinus node dysfunction   . Syncope    Recurrent, second to Bradycardia  . Urinary urgency    With Incontinence  . Ventral hernia    Asymptomatic     FAMILY HISTORY:   Family History  Problem Relation Age  of Onset  . Diabetes Mother   . Heart disease Mother     Pacemaker  . CAD Mother   . COPD Father   . Prostate cancer Father   . Pancreatic cancer Brother   . Other Brother     CABG  . Heart failure Brother   . Coronary artery disease Brother     SOCIAL HISTORY:   Social History  Substance Use Topics  . Smoking status: Never Smoker  . Smokeless tobacco: Never Used  . Alcohol use No     ALLERGIES:   Allergies  Allergen Reactions  . Crestor [Rosuvastatin] Other (See Comments)    Myalgias  . Hygroton [Chlorthalidone] Other (See Comments)    Syncope  . Lipitor [Atorvastatin] Other (See Comments)    Dizziness  . Metamucil [Psyllium] Other (See Comments)    Syncope  . Mevacor [Lovastatin] Other (See Comments)    Muscle cramps  . Pravastatin Other (See Comments)    Myalgias  . Vytorin [Ezetimibe-Simvastatin] Other (See Comments)    Myalgias  . Zetia [Ezetimibe] Other (See Comments)    Myalgias  . Toviaz [Fesoterodine Fumarate Er] Rash     CURRENT MEDICATIONS:   Current Outpatient Prescriptions  Medication Sig Dispense Refill  . amLODipine (NORVASC) 5 MG tablet Take 5 mg by mouth daily.     . Aspirin 81 MG EC tablet Take 40.5 mg by mouth every evening.     Marland Kitchen  cholecalciferol (VITAMIN D) 1000 units tablet Take 1,000 Units by mouth daily.    . finasteride (PROSCAR) 5 MG tablet Take 5 mg by mouth daily.     . nitroGLYCERIN (NITROSTAT) 0.4 MG SL tablet Place 0.4 mg under the tongue every 5 (five) minutes x 3 doses as needed. For chest pain    . Omega-3 Fatty Acids (FISH OIL) 1000 MG CAPS Take 1,000 mg by mouth daily.    . Tamsulosin HCl (FLOMAX) 0.4 MG CAPS Take 0.4 mg by mouth daily.     . valsartan-hydrochlorothiazide (DIOVAN-HCT) 160-25 MG per tablet Take 0.5 tablets by mouth daily.     . vitamin E (VITAMIN E) 400 UNIT capsule Take 400 Units by mouth every morning.     . vitamin k 100 MCG tablet Take 50 mcg by mouth every Monday.      No current  facility-administered medications for this visit.     REVIEW OF SYSTEMS:   [X]  denotes positive finding, [ ]  denotes negative finding Cardiac  Comments:  Chest pain or chest pressure:    Shortness of breath upon exertion:    Short of breath when lying flat:    Irregular heart rhythm:        Vascular    Pain in calf, thigh, or hip brought on by ambulation:    Pain in feet at night that wakes you up from your sleep:     Blood clot in your veins:    Leg swelling:         Pulmonary    Oxygen at home:    Productive cough:     Wheezing:         Neurologic    Sudden weakness in arms or legs:     Sudden numbness in arms or legs:     Sudden onset of difficulty speaking or slurred speech:    Temporary loss of vision in one eye:     Problems with dizziness:         Gastrointestinal    Blood in stool:     Vomited blood:         Genitourinary    Burning when urinating:     Blood in urine:        Psychiatric    Major depression:         Hematologic    Bleeding problems:    Problems with blood clotting too easily:        Skin    Rashes or ulcers:        Constitutional    Fever or chills:      PHYSICAL EXAM:   There were no vitals filed for this visit.  GENERAL: The patient is a well-nourished male, in no acute distress. The vital signs are documented above. CARDIAC: There is a regular rate and rhythm.  VASCULAR: Extremities are warm and well perfused.  No carotid bruits. PULMONARY: Non-labored respirations ABDOMEN: Soft and non-tender   MUSCULOSKELETAL: There are no major deformities or cyanosis. NEUROLOGIC: No focal weakness or paresthesias are detected. SKIN: There are no ulcers or rashes noted. PSYCHIATRIC: The patient has a normal affect.  STUDIES:   I have ordered and reviewed his duplex.  This shows a widely patent left renal artery stent.  The aneurysm sac remained stable at 5 cm.  MEDICAL ISSUES:   Status post FEVAR with loss of perfusion to the right  kidney, confirmed with angiography.  The patient remained stable.  The stent within his  left renal artery remains widely patent.  The aneurysm sac is stable.  The patient will follow up again in 6 months with repeat duplex studies.    [name], MD [facility] [phone] Pager [phone]

## 2016-10-23 DIAGNOSIS — D631 Anemia in chronic kidney disease: Secondary | ICD-10-CM | POA: Diagnosis not present

## 2016-10-23 DIAGNOSIS — D472 Monoclonal gammopathy: Secondary | ICD-10-CM | POA: Diagnosis not present

## 2016-10-23 DIAGNOSIS — I10 Essential (primary) hypertension: Secondary | ICD-10-CM | POA: Diagnosis not present

## 2016-10-23 DIAGNOSIS — N184 Chronic kidney disease, stage 4 (severe): Secondary | ICD-10-CM | POA: Diagnosis not present

## 2016-10-23 NOTE — Addendum Note (Signed)
Addended by: Lianne Cure A on: 10/23/2016 03:05 PM   Modules accepted: Orders

## 2016-11-18 DIAGNOSIS — Z79899 Other long term (current) drug therapy: Secondary | ICD-10-CM | POA: Diagnosis not present

## 2016-11-18 DIAGNOSIS — I714 Abdominal aortic aneurysm, without rupture: Secondary | ICD-10-CM | POA: Diagnosis not present

## 2016-11-18 DIAGNOSIS — R32 Unspecified urinary incontinence: Secondary | ICD-10-CM | POA: Diagnosis not present

## 2016-11-18 DIAGNOSIS — I2510 Atherosclerotic heart disease of native coronary artery without angina pectoris: Secondary | ICD-10-CM | POA: Diagnosis not present

## 2016-11-18 DIAGNOSIS — R269 Unspecified abnormalities of gait and mobility: Secondary | ICD-10-CM | POA: Diagnosis not present

## 2016-11-18 DIAGNOSIS — E559 Vitamin D deficiency, unspecified: Secondary | ICD-10-CM | POA: Diagnosis not present

## 2016-11-18 DIAGNOSIS — E782 Mixed hyperlipidemia: Secondary | ICD-10-CM | POA: Diagnosis not present

## 2016-11-18 DIAGNOSIS — Z0001 Encounter for general adult medical examination with abnormal findings: Secondary | ICD-10-CM | POA: Diagnosis not present

## 2016-11-18 DIAGNOSIS — Z1389 Encounter for screening for other disorder: Secondary | ICD-10-CM | POA: Diagnosis not present

## 2016-11-18 DIAGNOSIS — G609 Hereditary and idiopathic neuropathy, unspecified: Secondary | ICD-10-CM | POA: Diagnosis not present

## 2016-11-18 DIAGNOSIS — I10 Essential (primary) hypertension: Secondary | ICD-10-CM | POA: Diagnosis not present

## 2016-11-18 DIAGNOSIS — N33 Bladder disorders in diseases classified elsewhere: Secondary | ICD-10-CM | POA: Diagnosis not present

## 2016-11-18 DIAGNOSIS — H6120 Impacted cerumen, unspecified ear: Secondary | ICD-10-CM | POA: Diagnosis not present

## 2016-12-15 DIAGNOSIS — R351 Nocturia: Secondary | ICD-10-CM | POA: Diagnosis not present

## 2016-12-15 DIAGNOSIS — R350 Frequency of micturition: Secondary | ICD-10-CM | POA: Diagnosis not present

## 2016-12-15 DIAGNOSIS — N400 Enlarged prostate without lower urinary tract symptoms: Secondary | ICD-10-CM | POA: Diagnosis not present

## 2016-12-23 IMAGING — CT CT ABD-PELV W/O CM
2 of 5 series · 16 of 46 positions shown, 18 images · non-contrast
Comparison: 10/20/2014.

CLINICAL DATA: Followup stent placement for an abdominal aortic
aneurysm.

EXAM:
CT ABDOMEN AND PELVIS WITHOUT CONTRAST
TECHNIQUE: Multidetector CT imaging of the abdomen and pelvis was performed
following the standard protocol without IV contrast.

[Series 3: cor · coronal · 0.73mm/px · 3 of 95 slices shown]
[im 32/95  soft-tissue]
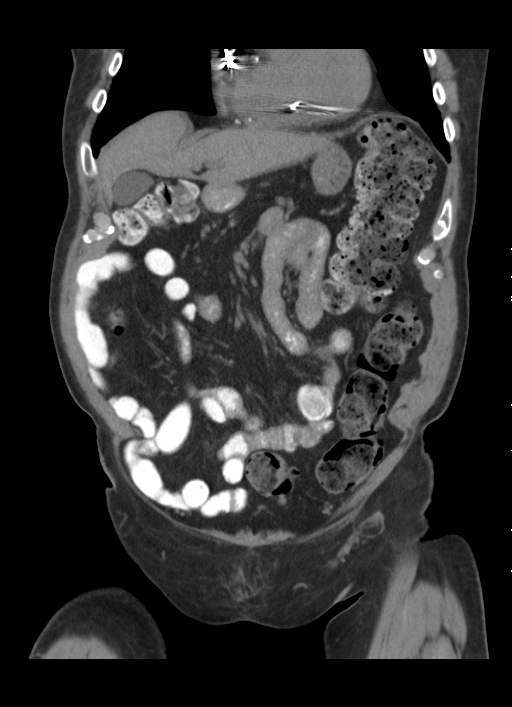
[im 42/95  soft-tissue]
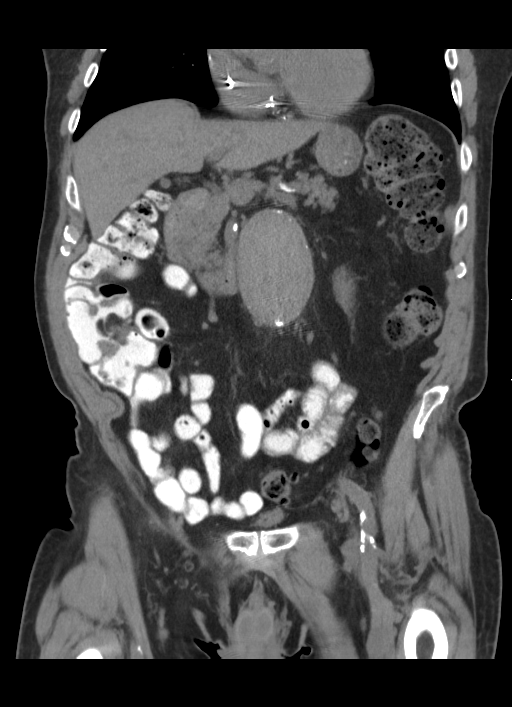
[im 53/95  soft-tissue]
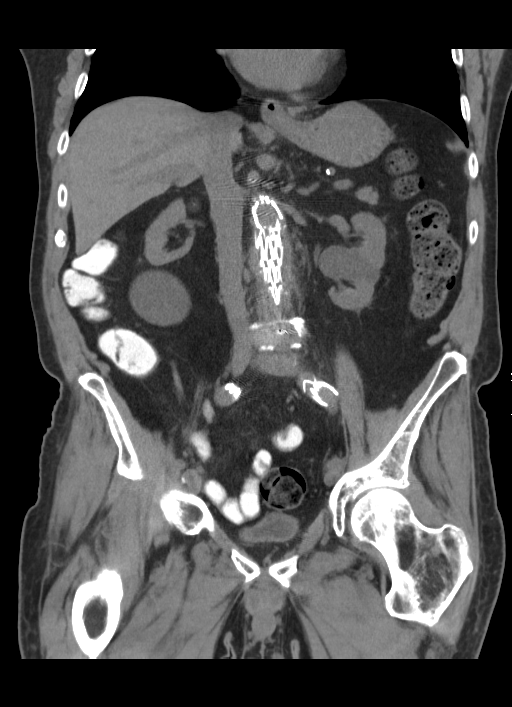

[Series 6: abd/pelvis 2 mm · axial · 0.71mm/px · z∈[-413,-9]mm · 13 of 224 slices shown, 15 images]
[im 11/224  soft-tissue]
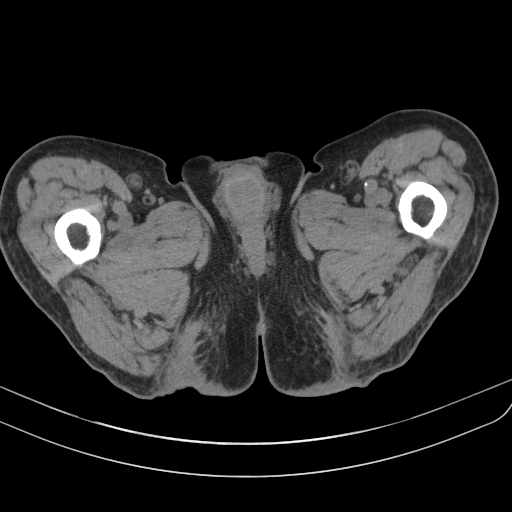
[im 11/224  bone]
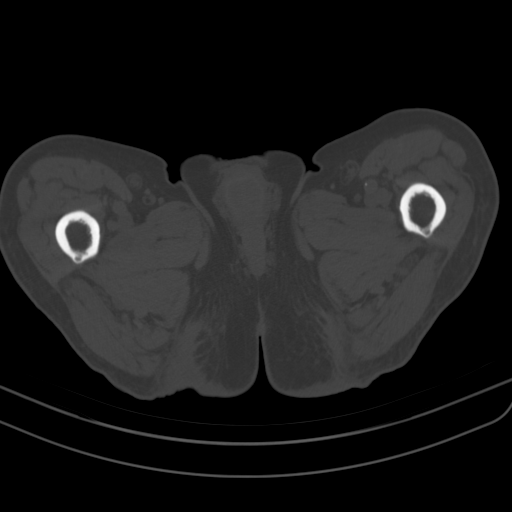
[im 32/224  soft-tissue]
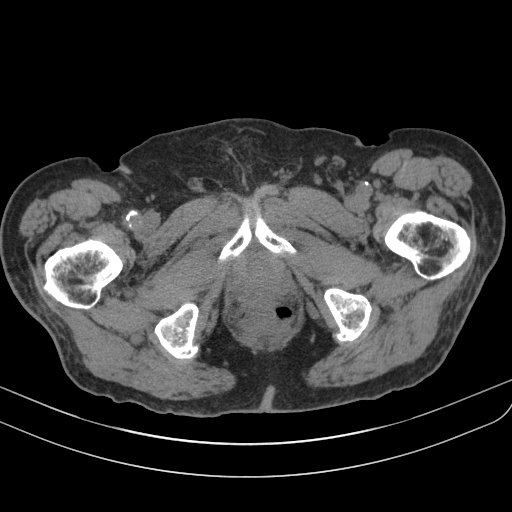
[im 43/224  soft-tissue]
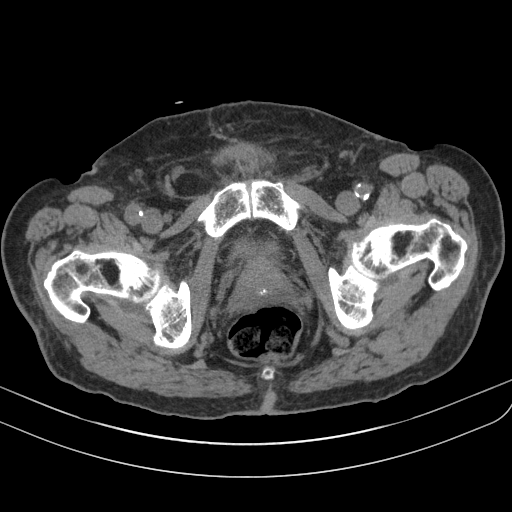
[im 64/224  soft-tissue]
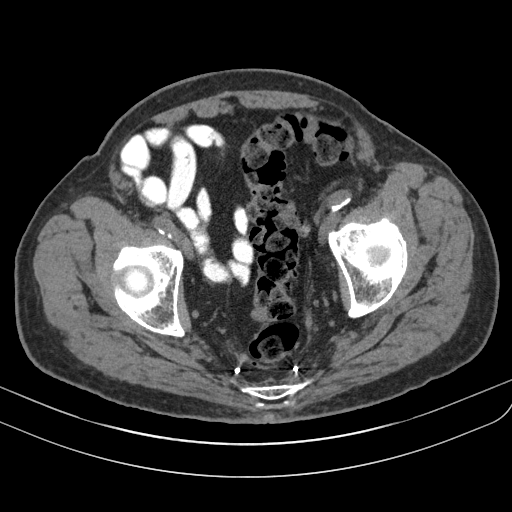
[im 75/224  soft-tissue]
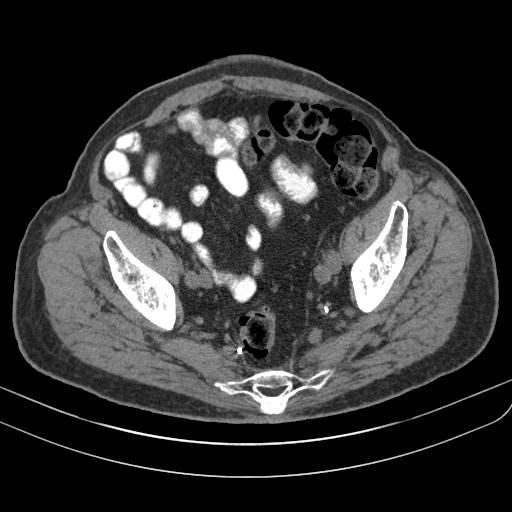
[im 96/224  soft-tissue]
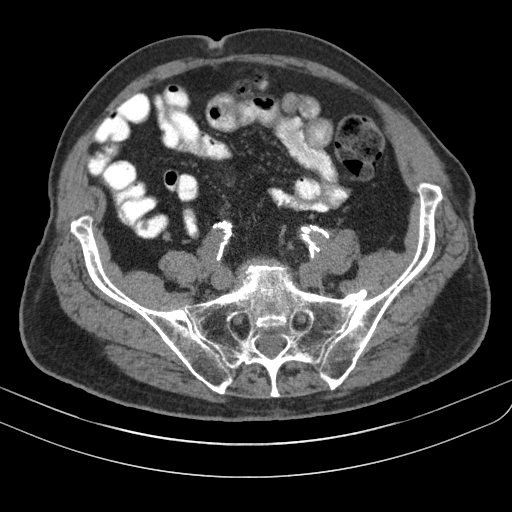
[im 117/224  soft-tissue]
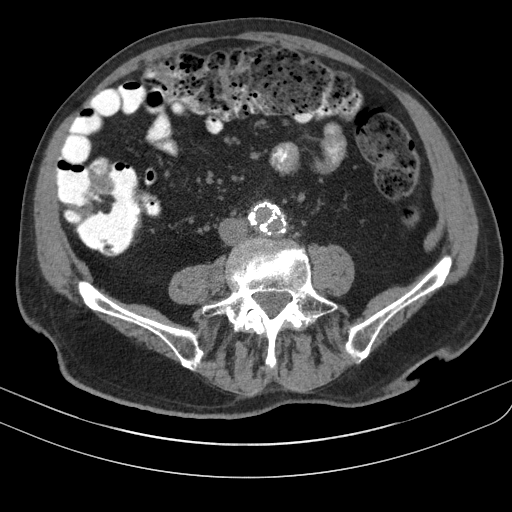
[im 128/224  soft-tissue]
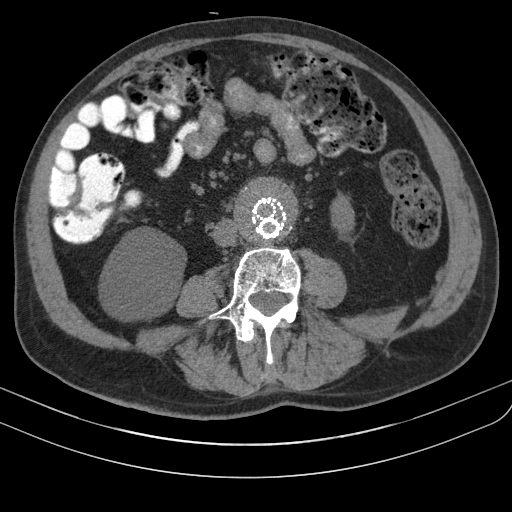
[im 149/224  soft-tissue]
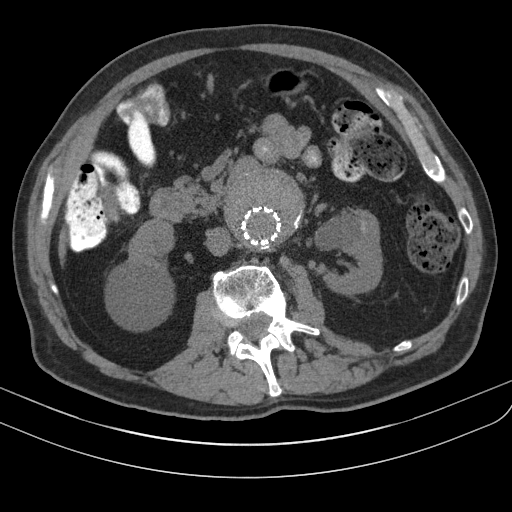
[im 149/224  bone]
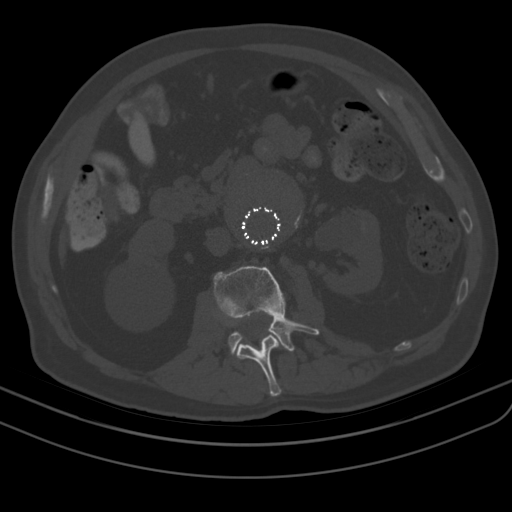
[im 160/224  soft-tissue]
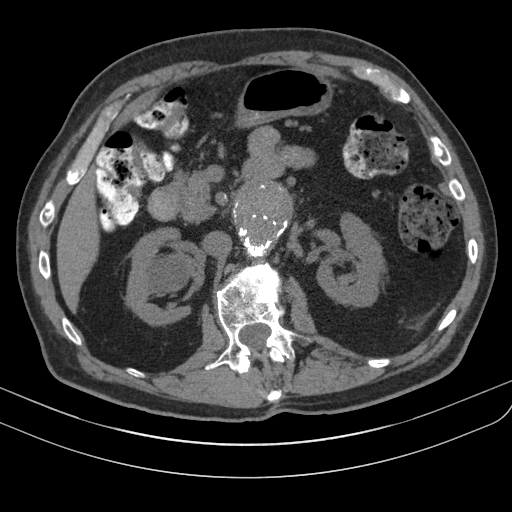
[im 181/224  soft-tissue]
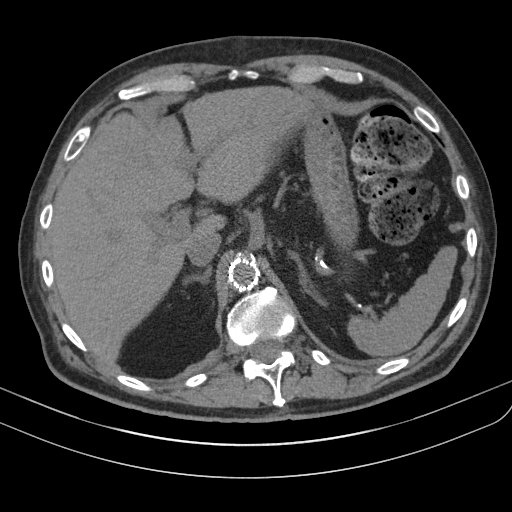
[im 192/224  soft-tissue]
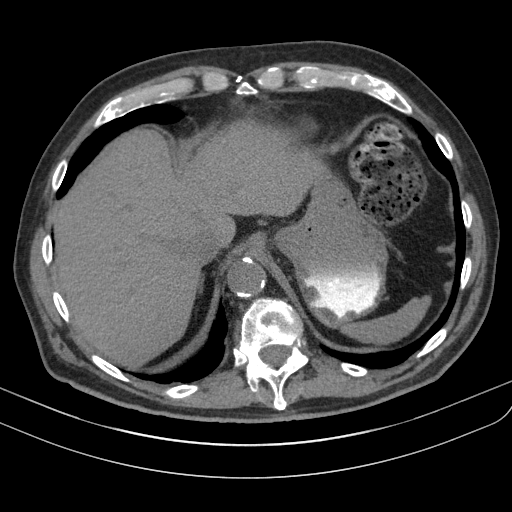
[im 213/224  soft-tissue]
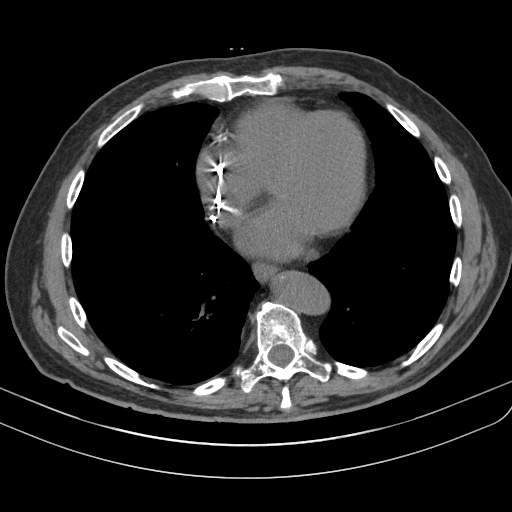

[16 of 46 positions shown; findings below may reference images not displayed]

FINDINGS: Interval aorto bi-iliac stent beginning above the level of the renal
arteries. There is also an a proximal left renal artery stent. The
iliac portions of the stent extend to the common iliac bifurcations.
The previously demonstrated 5.9 cm abdominal aortic aneurysm
currently measures 5.9 cm in maximum diameter on image number 84. No
retroperitoneal or intraperitoneal hemorrhage seen.

Bilateral renal cysts are again demonstrated. No urinary tract
calculi or hydronephrosis. Mildly to moderately enlarged prostate
gland containing some coarse calcification. Intracardiac pacemaker
leads.

Multiple sigmoid colon diverticula without evidence of
diverticulitis. Mildly prominent stool in the colon. Normal
appearing appendix. No gastric or small bowel abnormalities.

Unremarkable non contrasted appearance of the liver, spleen,
pancreas, gallbladder and adrenal glands. Proximal right inguinal
hernia containing fat. Thoracolumbar spine degenerative changes and
scoliosis. T11, T12 and L2 vertebral compression deformities are
unchanged. No acute fracture lines seen.

The previously demonstrated 5 mm right lower lobe lung nodule
currently measures 5 mm on image 10. A 3 mm right lower lobe lung
nodule on image number 2 is unchanged. No new nodules are seen.
IMPRESSION: 1. Interval aorto bi-iliac stent.
2. Stable 5.9 cm infrarenal abdominal aortic aneurysm.
3. Sigmoid diverticulosis.
4. Stable 5 mm and 3 mm right lower lobe lung nodules. These could
be followed with a repeat CT in 1 year.

## 2016-12-30 ENCOUNTER — Encounter: Payer: Medicare Other | Admitting: *Deleted

## 2016-12-30 ENCOUNTER — Telehealth: Payer: Self-pay | Admitting: Cardiology

## 2016-12-30 NOTE — Telephone Encounter (Signed)
Spoke with pt and reminded pt of remote transmission that is due today. Pt verbalized understanding.   

## 2016-12-31 ENCOUNTER — Telehealth: Payer: Self-pay | Admitting: Internal Medicine

## 2016-12-31 NOTE — Telephone Encounter (Signed)
New message    Pt is calling stating he can not get his machine to work to send a transmission. He is asking for help.

## 2016-12-31 NOTE — Telephone Encounter (Signed)
Spoke w/ patient and informed him to call tech support for help w/ trouble shooting his home monitor. Pt verbalized understanding.

## 2017-01-01 ENCOUNTER — Encounter: Payer: Self-pay | Admitting: Cardiology

## 2017-01-14 ENCOUNTER — Telehealth: Payer: Self-pay | Admitting: *Deleted

## 2017-01-14 NOTE — Telephone Encounter (Signed)
Hand written letter received from Edward Cannon explaining that his monitor is continually malfunctioning and he cannot get in touch with [facility] tech services. His monitor is connected through a landline phone- I suspect this is the problem. I have instructed him to unplug the monitor until he gets the cellular adapter. I am mailing the adapter today and he should call us when it is received to help him with set-up. He verbalizes understanding.

## 2017-01-19 ENCOUNTER — Ambulatory Visit (INDEPENDENT_AMBULATORY_CARE_PROVIDER_SITE_OTHER): Payer: Medicare Other | Admitting: *Deleted

## 2017-01-19 DIAGNOSIS — I495 Sick sinus syndrome: Secondary | ICD-10-CM

## 2017-01-20 NOTE — Progress Notes (Signed)
Remote pacemaker transmission.   

## 2017-01-21 ENCOUNTER — Encounter: Payer: Self-pay | Admitting: Cardiology

## 2017-01-23 LAB — CUP PACEART REMOTE DEVICE CHECK
Battery Remaining Percentage: 81 %
Brady Statistic AP VP Percent: 1 %
Brady Statistic AP VS Percent: 97 %
Brady Statistic AS VS Percent: 2.8 %
Date Time Interrogation Session: 20180806162511
Implantable Lead Implant Date: 20130708
Implantable Lead Location: 753859
Implantable Lead Location: 753860
Lead Channel Impedance Value: 480 Ohm
Lead Channel Pacing Threshold Amplitude: 1.25 V
Lead Channel Pacing Threshold Pulse Width: 0.6 ms
Lead Channel Sensing Intrinsic Amplitude: 4.5 mV
Lead Channel Setting Pacing Amplitude: 1.5 V
Lead Channel Setting Pacing Amplitude: 1.75 V
Lead Channel Setting Pacing Pulse Width: 0.6 ms
MDC IDC LEAD IMPLANT DT: 20130708
MDC IDC MSMT BATTERY REMAINING LONGEVITY: 103 mo
MDC IDC MSMT BATTERY VOLTAGE: 2.93 V
MDC IDC MSMT LEADCHNL RA IMPEDANCE VALUE: 390 Ohm
MDC IDC MSMT LEADCHNL RA PACING THRESHOLD AMPLITUDE: 0.75 V
MDC IDC MSMT LEADCHNL RA PACING THRESHOLD PULSEWIDTH: 0.4 ms
MDC IDC MSMT LEADCHNL RV SENSING INTR AMPL: 8.7 mV
MDC IDC PG IMPLANT DT: 20130708
MDC IDC SET LEADCHNL RV SENSING SENSITIVITY: 2 mV
MDC IDC STAT BRADY AS VP PERCENT: 1 %
MDC IDC STAT BRADY RA PERCENT PACED: 97 %
MDC IDC STAT BRADY RV PERCENT PACED: 1 %
Pulse Gen Serial Number: 7350984

## 2017-02-24 DIAGNOSIS — N184 Chronic kidney disease, stage 4 (severe): Secondary | ICD-10-CM | POA: Diagnosis not present

## 2017-02-24 DIAGNOSIS — D472 Monoclonal gammopathy: Secondary | ICD-10-CM | POA: Diagnosis not present

## 2017-02-24 DIAGNOSIS — I10 Essential (primary) hypertension: Secondary | ICD-10-CM | POA: Diagnosis not present

## 2017-02-24 DIAGNOSIS — D631 Anemia in chronic kidney disease: Secondary | ICD-10-CM | POA: Diagnosis not present

## 2017-03-25 DIAGNOSIS — Z23 Encounter for immunization: Secondary | ICD-10-CM | POA: Diagnosis not present

## 2017-04-10 ENCOUNTER — Encounter: Payer: Self-pay | Admitting: Internal Medicine

## 2017-04-10 ENCOUNTER — Ambulatory Visit (INDEPENDENT_AMBULATORY_CARE_PROVIDER_SITE_OTHER): Payer: Medicare Other | Admitting: Internal Medicine

## 2017-04-10 VITALS — BP 132/80 | HR 65 | Ht 65.0 in | Wt 154.0 lb

## 2017-04-10 DIAGNOSIS — Z950 Presence of cardiac pacemaker: Secondary | ICD-10-CM

## 2017-04-10 DIAGNOSIS — I480 Paroxysmal atrial fibrillation: Secondary | ICD-10-CM

## 2017-04-10 DIAGNOSIS — I495 Sick sinus syndrome: Secondary | ICD-10-CM

## 2017-04-10 DIAGNOSIS — I701 Atherosclerosis of renal artery: Secondary | ICD-10-CM

## 2017-04-10 DIAGNOSIS — I25810 Atherosclerosis of coronary artery bypass graft(s) without angina pectoris: Secondary | ICD-10-CM | POA: Diagnosis not present

## 2017-04-10 LAB — CUP PACEART INCLINIC DEVICE CHECK
Date Time Interrogation Session: 20181026165203
Implantable Lead Implant Date: 20130708
Implantable Lead Location: 753859
Implantable Pulse Generator Implant Date: 20130708
Lead Channel Setting Pacing Amplitude: 1.75 V
MDC IDC LEAD IMPLANT DT: 20130708
MDC IDC LEAD LOCATION: 753860
MDC IDC PG SERIAL: 7350984
MDC IDC SET LEADCHNL RV PACING AMPLITUDE: 1.5 V
MDC IDC SET LEADCHNL RV PACING PULSEWIDTH: 0.6 ms
MDC IDC SET LEADCHNL RV SENSING SENSITIVITY: 2 mV
Pulse Gen Model: 2110

## 2017-04-10 NOTE — Patient Instructions (Signed)
Medication Instructions: Your physician recommends that you continue on your current medications as directed. Please refer to the Current Medication list given to you today.  Labwork: None Ordered  Procedures/Testing: None Ordered  Follow-Up: Your physician wants you to follow-up in: 1 YEAR with Tommye Standard, PA. You will receive a reminder letter in the mail two months in advance. If you don't receive a letter, please call our office to schedule the follow-up appointment.  Remote monitoring is used to monitor your Pacemaker from home. This monitoring reduces the number of office visits required to check your device to one time per year. It allows [place] to keep an eye on the functioning of your device to ensure it is working properly. You are scheduled for a device check from home on 04/20/17. You may send your transmission at any time that day. If you have a wireless device, the transmission will be sent automatically. After your physician reviews your transmission, you will receive a postcard with your next transmission date.   If you need a refill on your cardiac medications before your next appointment, please call your pharmacy.

## 2017-04-10 NOTE — Progress Notes (Signed)
Patient Care Team: Josetta Huddle, MD as PCP - General (Internal Medicine) Deboraha Sprang, MD as Consulting Physician (Cardiology) Belva Crome, MD as Consulting Physician (Cardiology) Kathrynn Ducking, MD as Consulting Physician (Neurology)   HPI  Edward Cannon is a 83 y.o. male Seen in followup for a pacemaker implanted 7/13 for sinus node dysfunction in the setting of ischemic heart disease with prior bypass surgery and normal left ventricular function. Event recorder demonstrated prolonged sinus pauses. His chart describes atrial fib but no recent documentation by way of device  The patient denies chest pain, shortness of breath, nocturnal dyspnea, orthopnea or peripheral edema.  There have been no palpitations, lightheadedness or syncope.    His biggest problem is leg pain and weakness which he ascribes to statin therapy       Past Medical History:  Diagnosis Date  . AAA (abdominal aortic aneurysm) ([place]) 10/04/14   Per  Dr. Viviana Simpler  . Abnormality of gait 10/09/2014  . Anginal pain ([place]) 2005  . Atrial fibrillation ([place])   . Bouchard nodes (DJD hand)   . BPH (benign prostatic hypertrophy)   . Coronary artery disease S/P CABG   . Diverticulosis    Wtih Anatomotic friability on colonscopy  . H/O scarlet fever 1936  . History of bronchitis winter 2012  . Hx of colonic polyps   . Hyperlipidemia   . Hypertension   . Leg weakness, bilateral 10/09/2014  . Microscopic hematuria   . 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 Pacemaker   . Sinus node dysfunction   . Syncope    Recurrent, second to Bradycardia  . Urinary urgency    With Incontinence  . Ventral hernia    Asymptomatic    Past Surgical History:  Procedure Laterality Date  . ABDOMINAL AORTIC ENDOVASCULAR FENESTRATED STENT GRAFT N/A 02/15/2015   Procedure: ABDOMINAL AORTIC ENDOVASCULAR FENESTRATED STENT GRAFT;  Surgeon: Serafina Mitchell, MD;  Location: [place];  Service:  Vascular;  Laterality: N/A;  . CARDIAC CATHETERIZATION    . CATARACT EXTRACTION Bilateral   . COLON RESECTION  ~ 2005  . COLON SURGERY    . CORONARY ARTERY BYPASS GRAFT  ~ 2009   CABG X3  . EYE SURGERY Bilateral 2012  . INSERT / REPLACE / REMOVE PACEMAKER  12/22/11  . LIMA     Obtuse marginal branch of the left circumflex   . LOW ANTERIOR BOWEL RESECTION     for large villous  . MASTOIDECTOMY  1930  . PERIPHERAL VASCULAR CATHETERIZATION N/A 05/27/2016   Procedure: Abdominal Aortogram w/Lower Extremity;  Surgeon: Serafina Mitchell, MD;  Location: [facility];  Service: Cardiovascular;  Laterality: N/A;  . PERMANENT PACEMAKER INSERTION N/A 12/22/2011   Procedure: PERMANENT PACEMAKER INSERTION;  Surgeon: Deboraha Sprang, MD;  Location: [facility];  Service: Cardiovascular;  Laterality: N/A;  . RENAL ARTERY STENT Left 02/15/2015   Stenting and angioplasty of left renal artery  . RIMA     to the LAD  . SVG  2005   to posterior lateral branch of the RCA    Current Outpatient Prescriptions  Medication Sig Dispense Refill  . amLODipine (NORVASC) 5 MG tablet Take 5 mg by mouth daily.     . Aspirin 81 MG EC tablet Take 40.5 mg by mouth every evening.     . cholecalciferol (VITAMIN D) 1000 units tablet Take 1,000 Units by mouth daily.    Marland Kitchen  finasteride (PROSCAR) 5 MG tablet Take 5 mg by mouth daily.     . nitroGLYCERIN (NITROSTAT) 0.4 MG SL tablet Place 0.4 mg under the tongue every 5 (five) minutes x 3 doses as needed. For chest pain    . Omega-3 Fatty Acids (FISH OIL) 1000 MG CAPS Take 1,000 mg by mouth daily.    . Tamsulosin HCl (FLOMAX) 0.4 MG CAPS Take 0.4 mg by mouth daily.     . valsartan-hydrochlorothiazide (DIOVAN-HCT) 160-25 MG per tablet Take 0.5 tablets by mouth daily.     . vitamin E (VITAMIN E) 400 UNIT capsule Take 400 Units by mouth every morning.     . vitamin k 100 MCG tablet Take 50 mcg by mouth every Monday.      No current facility-administered medications for this  visit.     Allergies  Allergen Reactions  . Crestor [Rosuvastatin] Other (See Comments)    Myalgias  . Hygroton [Chlorthalidone] Other (See Comments)    Syncope  . Lipitor [Atorvastatin] Other (See Comments)    Dizziness  . Metamucil [Psyllium] Other (See Comments)    Syncope  . Mevacor [Lovastatin] Other (See Comments)    Muscle cramps  . Pravastatin Other (See Comments)    Myalgias  . Vytorin [Ezetimibe-Simvastatin] Other (See Comments)    Myalgias  . Zetia [Ezetimibe] Other (See Comments)    Myalgias  . Toviaz [Fesoterodine Fumarate Er] Rash    Review of Systems negative except from HPI and PMH  Physical Exam BP 132/80   Pulse 65   Ht 5\' 5"  (1.651 m)   Wt 154 lb (69.9 kg)   SpO2 97%   BMI 25.63 kg/m  Well developed and nourished in no acute distress HENT normal Neck supple with JVP-flat Clear Device pocket well healed; without hematoma or erythema.  There is no tethering  Regular rate and rhythm, no murmurs or gallops Abd-soft with active BS No Clubbing cyanosis edema Skin-warm and dry A & Oriented  Grossly normal sensory and motor function   ECG  Atrial pacing at 65 20/11/42 IRBBB Low voltage QRS   Assessment and  Plan  Sinus node dysfunction  Hypertension  Ischemic heart disease  Syncope     RNRAVS   Pacemaker  St Jude The patient's device was interrogated and the information was fully reviewed.       Without symptoms of ischemia  \Infrequent episodes of repetitive non-reentrant AV synchrony--will not reprogram  Blood pressure is not checked at home here is quite reasonable

## 2017-04-20 ENCOUNTER — Ambulatory Visit (INDEPENDENT_AMBULATORY_CARE_PROVIDER_SITE_OTHER): Payer: Medicare Other | Admitting: *Deleted

## 2017-04-20 DIAGNOSIS — I495 Sick sinus syndrome: Secondary | ICD-10-CM

## 2017-04-20 NOTE — Progress Notes (Signed)
Remote pacemaker transmission.   

## 2017-04-22 ENCOUNTER — Encounter: Payer: Self-pay | Admitting: Cardiology

## 2017-04-27 ENCOUNTER — Ambulatory Visit: Payer: Medicare Other | Admitting: Surgery

## 2017-04-27 ENCOUNTER — Other Ambulatory Visit (HOSPITAL_COMMUNITY): Payer: Medicare Other

## 2017-04-27 ENCOUNTER — Encounter (HOSPITAL_COMMUNITY): Payer: Medicare Other

## 2017-05-04 ENCOUNTER — Ambulatory Visit (INDEPENDENT_AMBULATORY_CARE_PROVIDER_SITE_OTHER): Payer: Medicare Other | Admitting: Surgery

## 2017-05-04 ENCOUNTER — Ambulatory Visit (HOSPITAL_COMMUNITY)
Admission: RE | Admit: 2017-05-04 | Discharge: 2017-05-04 | Disposition: A | Discharge status: Home / Self Care | Payer: Medicare Other | Source: Ambulatory Visit | Attending: Surgery | Admitting: Surgery

## 2017-05-04 ENCOUNTER — Ambulatory Visit (INDEPENDENT_AMBULATORY_CARE_PROVIDER_SITE_OTHER)
Admission: RE | Admit: 2017-05-04 | Discharge: 2017-05-04 | Disposition: A | Discharge status: Home / Self Care | Payer: Medicare Other | Source: Ambulatory Visit | Attending: Surgery | Admitting: Surgery

## 2017-05-04 ENCOUNTER — Encounter: Payer: Self-pay | Admitting: Surgery

## 2017-05-04 VITALS — BP 182/87 | HR 72 | Temp 96.9°F | Resp 20 | Ht 65.0 in | Wt 155.0 lb

## 2017-05-04 DIAGNOSIS — I714 Abdominal aortic aneurysm, without rupture, unspecified: Secondary | ICD-10-CM

## 2017-05-04 DIAGNOSIS — I701 Atherosclerosis of renal artery: Secondary | ICD-10-CM | POA: Diagnosis not present

## 2017-05-04 DIAGNOSIS — Z9889 Other specified postprocedural states: Secondary | ICD-10-CM

## 2017-05-04 NOTE — Progress Notes (Signed)
[facility] [place]  Patient name: Edward Cannon MRN: [id number] DOB: 1926-04-26 Sex: male   REASON FOR VISIT:    Follow up  HISOTRY OF PRESENT ILLNESS:    Edward Cannon is a 83 y.o. male who on 02/18/2015 he underwent a fenestrated endovascular repair of a juxtarenal abdominal aortic aneurysm including stenting of his left renal artery. This was done using a ZFEN device. The patient's only postoperative issue has been an increase in his renal function above his baseline. At the time of the operation he had a hemodynamically significant left renal artery stenosis which was stented.  In December 2017, his right kidney was not visualized on ultrasound.  Therefore on 05/27/2016, he went for angiography with CO2.  This confirmed occlusion of the right renal artery.  He has been followed by nephrology.  Most recent creatinine I have was from December 2017 at 2.8.  He has no complaints today.  PAST MEDICAL HISTORY:   Past Medical History:  Diagnosis Date  . AAA (abdominal aortic aneurysm) ([place]) 10/04/14   Per  Dr. Viviana Simpler  . Abnormality of gait 10/09/2014  . Anginal pain ([place]) 2005  . Atrial fibrillation ([place])   . Bouchard nodes (DJD hand)   . BPH (benign prostatic hypertrophy)   . Coronary artery disease S/P CABG   . Diverticulosis    Wtih Anatomotic friability on colonscopy  . H/O scarlet fever 1936  . History of bronchitis winter 2012  . Hx of colonic polyps   . Hyperlipidemia   . Hypertension   . Leg weakness, bilateral 10/09/2014  . Microscopic hematuria   . 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 Pacemaker   . Sinus node dysfunction   . Syncope    Recurrent, second to Bradycardia  . Urinary urgency    With Incontinence  . Ventral hernia    Asymptomatic     FAMILY HISTORY:   Family History  Problem Relation Age of Onset  . Diabetes Mother   . Heart disease Mother          Pacemaker  . CAD Mother   . COPD Father   . Prostate cancer Father   . Pancreatic cancer Brother   . Other Brother        CABG  . Heart failure Brother   . Coronary artery disease Brother     SOCIAL HISTORY:   Social History   Tobacco Use  . Smoking status: Never Smoker  . Smokeless tobacco: Never Used  Substance Use Topics  . Alcohol use: No    Alcohol/week: 0.6 oz    Types: 1 Glasses of wine per week     ALLERGIES:   Allergies  Allergen Reactions  . Crestor [Rosuvastatin] Other (See Comments)    Myalgias  . Hygroton [Chlorthalidone] Other (See Comments)    Syncope  . Lipitor [Atorvastatin] Other (See Comments)    Dizziness  . Metamucil [Psyllium] Other (See Comments)    Syncope  . Mevacor [Lovastatin] Other (See Comments)    Muscle cramps  . Pravastatin Other (See Comments)    Myalgias  . Vytorin [Ezetimibe-Simvastatin] Other (See Comments)    Myalgias  . Zetia [Ezetimibe] Other (See Comments)    Myalgias  . Toviaz [Fesoterodine Fumarate Er] Rash     CURRENT MEDICATIONS:   Current Outpatient Medications  Medication Sig Dispense Refill  . amLODipine (NORVASC) 5 MG tablet Take 5 mg by mouth daily.     Marland Kitchen  Aspirin 81 MG EC tablet Take 40.5 mg by mouth every evening.     . cholecalciferol (VITAMIN D) 1000 units tablet Take 1,000 Units by mouth daily.    . finasteride (PROSCAR) 5 MG tablet Take 5 mg by mouth daily.     . nitroGLYCERIN (NITROSTAT) 0.4 MG SL tablet Place 0.4 mg under the tongue every 5 (five) minutes x 3 doses as needed. For chest pain    . Omega-3 Fatty Acids (FISH OIL) 1000 MG CAPS Take 1,000 mg by mouth daily.    . Tamsulosin HCl (FLOMAX) 0.4 MG CAPS Take 0.4 mg by mouth daily.     . valsartan-hydrochlorothiazide (DIOVAN-HCT) 160-25 MG per tablet Take 0.5 tablets by mouth daily.     . vitamin E (VITAMIN E) 400 UNIT capsule Take 400 Units by mouth every morning.     . vitamin k 100 MCG tablet Take 50 mcg by mouth every Monday.      No  current facility-administered medications for this visit.     REVIEW OF SYSTEMS:   [X]  denotes positive finding, [ ]  denotes negative finding Cardiac  Comments:  Chest pain or chest pressure:    Shortness of breath upon exertion:    Short of breath when lying flat:    Irregular heart rhythm:        Vascular    Pain in calf, thigh, or hip brought on by ambulation:    Pain in feet at night that wakes you up from your sleep:     Blood clot in your veins:    Leg swelling:         Pulmonary    Oxygen at home:    Productive cough:     Wheezing:         Neurologic    Sudden weakness in arms or legs:     Sudden numbness in arms or legs:     Sudden onset of difficulty speaking or slurred speech:    Temporary loss of vision in one eye:     Problems with dizziness:         Gastrointestinal    Blood in stool:     Vomited blood:         Genitourinary    Burning when urinating:     Blood in urine:        Psychiatric    Major depression:         Hematologic    Bleeding problems:    Problems with blood clotting too easily:        Skin    Rashes or ulcers:        Constitutional    Fever or chills:      PHYSICAL EXAM:   Vitals:   05/04/17 0928 05/04/17 0935  BP: (!) 192/86 (!) 182/87  Pulse: 72   Resp: 20   Temp: (!) 96.9 F (36.1 C)   TempSrc: Oral   SpO2: 99%   Weight: 155 lb (70.3 kg)   Height: 5\' 5"  (1.651 m)     GENERAL: The patient is a well-nourished male, in no acute distress. The vital signs are documented above. CARDIAC: There is a regular rate and rhythm.  VASCULAR: No carotid bruits PULMONARY: Non-labored respirations ABDOMEN: Soft and non-tender.  MUSCULOSKELETAL: There are no major deformities or cyanosis. NEUROLOGIC: No focal weakness or paresthesias are detected. SKIN: There are no ulcers or rashes noted. PSYCHIATRIC: The patient has a normal affect.  STUDIES:   I have  reviewed his ultrasound today.  This shows no significant change from  May 2018.  Maximum aortic diameter is 5 cm.  The stent within the left renal artery was poorly visualized but remains patent  MEDICAL ISSUES:   Status post fenestrated endovascular aneurysm repair with subsequent right renal artery occlusion.  The patient has been doing very well.  His aneurysm continues to decrease in size.  His renal stent remains patent.  He will continue with his current medication regimen.  I have him scheduled for follow-up with repeat ultrasound in 1 year.    [name], MD [facility] [phone] Pager [phone]

## 2017-05-05 LAB — CUP PACEART REMOTE DEVICE CHECK
Battery Voltage: 2.93 V
Brady Statistic AP VP Percent: 1 %
Brady Statistic AS VP Percent: 1 %
Brady Statistic RA Percent Paced: 97 %
Date Time Interrogation Session: 20181105193742
Implantable Lead Implant Date: 20130708
Implantable Pulse Generator Implant Date: 20130708
Lead Channel Impedance Value: 490 Ohm
Lead Channel Pacing Threshold Pulse Width: 0.6 ms
Lead Channel Sensing Intrinsic Amplitude: 10 mV
Lead Channel Setting Pacing Amplitude: 1.5 V
Lead Channel Setting Pacing Amplitude: 1.625
MDC IDC LEAD IMPLANT DT: 20130708
MDC IDC LEAD LOCATION: 753859
MDC IDC LEAD LOCATION: 753860
MDC IDC MSMT BATTERY REMAINING LONGEVITY: 103 mo
MDC IDC MSMT BATTERY REMAINING PERCENTAGE: 81 %
MDC IDC MSMT LEADCHNL RA IMPEDANCE VALUE: 400 Ohm
MDC IDC MSMT LEADCHNL RA PACING THRESHOLD AMPLITUDE: 0.625 V
MDC IDC MSMT LEADCHNL RA PACING THRESHOLD PULSEWIDTH: 0.4 ms
MDC IDC MSMT LEADCHNL RA SENSING INTR AMPL: 3.7 mV
MDC IDC MSMT LEADCHNL RV PACING THRESHOLD AMPLITUDE: 1.25 V
MDC IDC SET LEADCHNL RV PACING PULSEWIDTH: 0.6 ms
MDC IDC SET LEADCHNL RV SENSING SENSITIVITY: 2 mV
MDC IDC STAT BRADY AP VS PERCENT: 97 %
MDC IDC STAT BRADY AS VS PERCENT: 3 %
MDC IDC STAT BRADY RV PERCENT PACED: 1 %
Pulse Gen Model: 2110
Pulse Gen Serial Number: 7350984

## 2017-05-06 NOTE — Addendum Note (Signed)
Addended by: Lianne Cure A on: 05/06/2017 10:57 AM   Modules accepted: Orders

## 2017-05-22 DIAGNOSIS — E782 Mixed hyperlipidemia: Secondary | ICD-10-CM | POA: Diagnosis not present

## 2017-05-22 DIAGNOSIS — H6123 Impacted cerumen, bilateral: Secondary | ICD-10-CM | POA: Diagnosis not present

## 2017-05-22 DIAGNOSIS — G729 Myopathy, unspecified: Secondary | ICD-10-CM | POA: Diagnosis not present

## 2017-05-22 DIAGNOSIS — E559 Vitamin D deficiency, unspecified: Secondary | ICD-10-CM | POA: Diagnosis not present

## 2017-05-22 DIAGNOSIS — R32 Unspecified urinary incontinence: Secondary | ICD-10-CM | POA: Diagnosis not present

## 2017-05-22 DIAGNOSIS — I714 Abdominal aortic aneurysm, without rupture: Secondary | ICD-10-CM | POA: Diagnosis not present

## 2017-05-22 DIAGNOSIS — I10 Essential (primary) hypertension: Secondary | ICD-10-CM | POA: Diagnosis not present

## 2017-07-07 DIAGNOSIS — D472 Monoclonal gammopathy: Secondary | ICD-10-CM | POA: Diagnosis not present

## 2017-07-07 DIAGNOSIS — D631 Anemia in chronic kidney disease: Secondary | ICD-10-CM | POA: Diagnosis not present

## 2017-07-07 DIAGNOSIS — I129 Hypertensive chronic kidney disease with stage 1 through stage 4 chronic kidney disease, or unspecified chronic kidney disease: Secondary | ICD-10-CM | POA: Diagnosis not present

## 2017-07-07 DIAGNOSIS — N184 Chronic kidney disease, stage 4 (severe): Secondary | ICD-10-CM | POA: Diagnosis not present

## 2017-07-20 ENCOUNTER — Ambulatory Visit (INDEPENDENT_AMBULATORY_CARE_PROVIDER_SITE_OTHER): Payer: Medicare Other | Admitting: *Deleted

## 2017-07-20 ENCOUNTER — Telehealth: Payer: Self-pay | Admitting: Cardiology

## 2017-07-20 DIAGNOSIS — I495 Sick sinus syndrome: Secondary | ICD-10-CM | POA: Diagnosis not present

## 2017-07-20 NOTE — Telephone Encounter (Signed)
LMOVM reminding pt to send remote transmission.   

## 2017-07-21 ENCOUNTER — Encounter: Payer: Self-pay | Admitting: Cardiology

## 2017-07-21 NOTE — Progress Notes (Signed)
Remote pacemaker transmission.   

## 2017-08-07 LAB — CUP PACEART REMOTE DEVICE CHECK
Brady Statistic AP VP Percent: 1 %
Brady Statistic AP VS Percent: 97 %
Brady Statistic AS VP Percent: 1 %
Brady Statistic AS VS Percent: 2.6 %
Brady Statistic RA Percent Paced: 97 %
Brady Statistic RV Percent Paced: 1 %
Date Time Interrogation Session: 20190205161834
Implantable Lead Implant Date: 20130708
Implantable Lead Location: 753859
Implantable Lead Model: 1948
Lead Channel Pacing Threshold Amplitude: 1.625 V
Lead Channel Pacing Threshold Pulse Width: 0.4 ms
Lead Channel Pacing Threshold Pulse Width: 0.6 ms
Lead Channel Sensing Intrinsic Amplitude: 8.2 mV
Lead Channel Setting Pacing Amplitude: 1.875
Lead Channel Setting Pacing Pulse Width: 0.6 ms
Lead Channel Setting Sensing Sensitivity: 2 mV
MDC IDC LEAD IMPLANT DT: 20130708
MDC IDC LEAD LOCATION: 753860
MDC IDC MSMT BATTERY REMAINING LONGEVITY: 91 mo
MDC IDC MSMT BATTERY REMAINING PERCENTAGE: 73 %
MDC IDC MSMT BATTERY VOLTAGE: 2.92 V
MDC IDC MSMT LEADCHNL RA IMPEDANCE VALUE: 380 Ohm
MDC IDC MSMT LEADCHNL RA PACING THRESHOLD AMPLITUDE: 0.875 V
MDC IDC MSMT LEADCHNL RA SENSING INTR AMPL: 3.4 mV
MDC IDC MSMT LEADCHNL RV IMPEDANCE VALUE: 490 Ohm
MDC IDC PG IMPLANT DT: 20130708
MDC IDC SET LEADCHNL RV PACING AMPLITUDE: 1.875
Pulse Gen Model: 2110
Pulse Gen Serial Number: 7350984

## 2017-08-24 DIAGNOSIS — H353131 Nonexudative age-related macular degeneration, bilateral, early dry stage: Secondary | ICD-10-CM | POA: Diagnosis not present

## 2017-10-02 ENCOUNTER — Encounter: Payer: Self-pay | Admitting: Interventional Cardiology

## 2017-10-14 ENCOUNTER — Ambulatory Visit (INDEPENDENT_AMBULATORY_CARE_PROVIDER_SITE_OTHER): Payer: Medicare Other | Admitting: Interventional Cardiology

## 2017-10-14 ENCOUNTER — Encounter: Payer: Self-pay | Admitting: Interventional Cardiology

## 2017-10-14 VITALS — BP 142/68 | HR 86 | Ht 66.0 in | Wt 154.2 lb

## 2017-10-14 DIAGNOSIS — I129 Hypertensive chronic kidney disease with stage 1 through stage 4 chronic kidney disease, or unspecified chronic kidney disease: Secondary | ICD-10-CM | POA: Diagnosis not present

## 2017-10-14 DIAGNOSIS — I714 Abdominal aortic aneurysm, without rupture, unspecified: Secondary | ICD-10-CM

## 2017-10-14 DIAGNOSIS — Z950 Presence of cardiac pacemaker: Secondary | ICD-10-CM | POA: Diagnosis not present

## 2017-10-14 DIAGNOSIS — I495 Sick sinus syndrome: Secondary | ICD-10-CM

## 2017-10-14 DIAGNOSIS — N184 Chronic kidney disease, stage 4 (severe): Secondary | ICD-10-CM

## 2017-10-14 DIAGNOSIS — I480 Paroxysmal atrial fibrillation: Secondary | ICD-10-CM | POA: Diagnosis not present

## 2017-10-14 DIAGNOSIS — I25810 Atherosclerosis of coronary artery bypass graft(s) without angina pectoris: Secondary | ICD-10-CM

## 2017-10-14 LAB — BASIC METABOLIC PANEL
BUN / CREAT RATIO: 15 (ref 10–24)
BUN: 43 mg/dL — ABNORMAL HIGH (ref 8–27)
CHLORIDE: 100 mmol/L (ref 96–106)
CO2: 22 mmol/L (ref 20–29)
Calcium: 8.4 mg/dL — ABNORMAL LOW (ref 8.6–10.2)
Creatinine, Ser: 2.82 mg/dL — ABNORMAL HIGH (ref 0.76–1.27)
GFR calc non Af Amer: 19 mL/min/{1.73_m2} — ABNORMAL LOW (ref >59)
GFR, EST AFRICAN AMERICAN: 22 mL/min/{1.73_m2} — AB (ref >59)
Glucose: 92 mg/dL (ref 65–99)
POTASSIUM: 4.3 mmol/L (ref 3.5–5.2)
SODIUM: 138 mmol/L (ref 134–144)

## 2017-10-14 NOTE — Patient Instructions (Signed)
Medication Instructions:  Your physician recommends that you continue on your current medications as directed. Please refer to the Current Medication list given to you today.  Labwork: BMET today  Testing/Procedures: None  Follow-Up: Your physician wants you to follow-up in: 1 year with Dr. Tamala Julian.  You will receive a reminder letter in the mail two months in advance. If you don't receive a letter, please call our office to schedule the follow-up appointment.   Any Other Special Instructions Will Be Listed Below (If Applicable).     If you need a refill on your cardiac medications before your next appointment, please call your pharmacy.

## 2017-10-14 NOTE — Progress Notes (Signed)
Cardiology Office Note    Date:  10/14/2017   ID:  Edward Cannon, DOB Aug 31, 1925, MRN [id number]  PCP:  Josetta Huddle, MD  Cardiologist: Sinclair Grooms, MD   Chief Complaint  Patient presents with  . Atrial Fibrillation  . Coronary Artery Disease    History of Present Illness:  Edward Cannon is a 84 y.o. male for followup of a pacemaker implanted 7/13 for sinus node dysfunction in the setting of ischemic heart disease with prior bypass surgeryRIMA to LAD, LIMA to OM, and SVG to PDA, and normal left ventricular function. He also has a history of atrial fibrillation. Abdominal aortic aneurysm status post and oh repair 2016. Stage IV chronic kidney disease.  Getting somewhat forgetful.  Voices no complaints.  Denies angina.  No swelling or dyspnea.  He has not had syncope, palpitations, or racing heart.  No nitroglycerin use.  Appetite is been stable.  States he has not seen Dr. Mertha Finders in quite some time.   Past Medical History:  Diagnosis Date  . AAA (abdominal aortic aneurysm) ([place]) 10/04/14   Per  Dr. Viviana Simpler  . Abnormality of gait 10/09/2014  . Anginal pain ([place]) 2005  . Atrial fibrillation ([place])   . Bouchard nodes (DJD hand)   . BPH (benign prostatic hypertrophy)   . Coronary artery disease S/P CABG   . Diverticulosis    Wtih Anatomotic friability on colonscopy  . H/O scarlet fever 1936  . History of bronchitis winter 2012  . Hx of colonic polyps   . Hyperlipidemia   . Hypertension   . Leg weakness, bilateral 10/09/2014  . Microscopic hematuria   . 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 Pacemaker   . Sinus node dysfunction   . Syncope    Recurrent, second to Bradycardia  . Urinary urgency    With Incontinence  . Ventral hernia    Asymptomatic    Past Surgical History:  Procedure Laterality Date  . ABDOMINAL AORTIC ENDOVASCULAR FENESTRATED STENT GRAFT N/A 02/15/2015   Procedure: ABDOMINAL AORTIC ENDOVASCULAR  FENESTRATED STENT GRAFT;  Surgeon: Serafina Mitchell, MD;  Location: [facility];  Service: Vascular;  Laterality: N/A;  . CARDIAC CATHETERIZATION    . CATARACT EXTRACTION Bilateral   . COLON RESECTION  ~ 2005  . COLON SURGERY    . CORONARY ARTERY BYPASS GRAFT  ~ 2009   CABG X3  . EYE SURGERY Bilateral 2012  . INSERT / REPLACE / REMOVE PACEMAKER  12/22/11  . LIMA     Obtuse marginal branch of the left circumflex   . LOW ANTERIOR BOWEL RESECTION     for large villous  . MASTOIDECTOMY  1930  . PERIPHERAL VASCULAR CATHETERIZATION N/A 05/27/2016   Procedure: Abdominal Aortogram w/Lower Extremity;  Surgeon: Serafina Mitchell, MD;  Location: [facility];  Service: Cardiovascular;  Laterality: N/A;  . PERMANENT PACEMAKER INSERTION N/A 12/22/2011   Procedure: PERMANENT PACEMAKER INSERTION;  Surgeon: Deboraha Sprang, MD;  Location: [facility] CATH LAB;  Service: Cardiovascular;  Laterality: N/A;  . RENAL ARTERY STENT Left 02/15/2015   Stenting and angioplasty of left renal artery  . RIMA     to the LAD  . SVG  2005   to posterior lateral branch of the RCA    Current Medications: Outpatient Medications Prior to Visit  Medication Sig Dispense Refill  . amLODipine (NORVASC) 5 MG tablet Take 5 mg by mouth daily.     Marland Kitchen  Aspirin 81 MG EC tablet Take 40.5 mg by mouth every evening.     . cholecalciferol (VITAMIN D) 1000 units tablet Take 1,000 Units by mouth daily.    . finasteride (PROSCAR) 5 MG tablet Take 5 mg by mouth daily.     . nitroGLYCERIN (NITROSTAT) 0.4 MG SL tablet Place 0.4 mg under the tongue every 5 (five) minutes x 3 doses as needed. For chest pain    . Omega-3 Fatty Acids (FISH OIL) 1000 MG CAPS Take 1,000 mg by mouth daily.    . Tamsulosin HCl (FLOMAX) 0.4 MG CAPS Take 0.4 mg by mouth daily.     . valsartan-hydrochlorothiazide (DIOVAN-HCT) 160-25 MG per tablet Take 0.5 tablets by mouth daily.     . vitamin E (VITAMIN E) 400 UNIT capsule Take 400 Units by mouth every morning.     . vitamin k 100  MCG tablet Take 50 mcg by mouth every Monday.      No facility-administered medications prior to visit.      Allergies:   Crestor [rosuvastatin]; Hygroton [chlorthalidone]; Lipitor [atorvastatin]; Metamucil [psyllium]; Mevacor [lovastatin]; Pravastatin; Vytorin [ezetimibe-simvastatin]; Zetia [ezetimibe]; and Toviaz [fesoterodine fumarate er]   Social History   Socioeconomic History  . Marital status: Married    Spouse name: Not on file  . Number of children: 1  . Years of education: PhD  . Highest education level: Not on file  Occupational History  . Occupation: retired  Scientific laboratory technician  . Financial resource strain: Not on file  . Food insecurity:    Worry: Not on file    Inability: Not on file  . Transportation needs:    Medical: Not on file    Non-medical: Not on file  Tobacco Use  . Smoking status: Never Smoker  . Smokeless tobacco: Never Used  Substance and Sexual Activity  . Alcohol use: No    Alcohol/week: 0.6 oz    Types: 1 Glasses of wine per week  . Drug use: No  . Sexual activity: Never  Lifestyle  . Physical activity:    Days per week: Not on file    Minutes per session: Not on file  . Stress: Not on file  Relationships  . Social connections:    Talks on phone: Not on file    Gets together: Not on file    Attends religious service: Not on file    Active member of club or organization: Not on file    Attends meetings of clubs or organizations: Not on file    Relationship status: Not on file  Other Topics Concern  . Not on file  Social History Narrative   Patient is right handed.   Patient does not drink caffeine.     Family History:  The patient's family history includes CAD in his mother; COPD in his father; Coronary artery disease in his brother; Diabetes in his mother; Heart disease in his mother; Heart failure in his brother; Other in his brother; Pancreatic cancer in his brother; Prostate cancer in his father.   ROS:   Please see the history of  present illness.    none  All other systems reviewed and are negative.   PHYSICAL EXAM:   VS:  BP (!) 142/68   Pulse 86   Ht 5\' 6"  (1.676 m)   Wt 154 lb 3.2 oz (69.9 kg)   BMI 24.89 kg/m    GEN: Well nourished, well developed, in no acute distress  HEENT: normal  Neck: no JVD,  carotid bruits, or masses Cardiac: RRR; no murmurs, rubs, or gallops,no edema  Respiratory:  clear to auscultation bilaterally, normal work of breathing GI: soft, nontender, nondistended, + BS MS: no deformity or atrophy  Skin: warm and dry, no rash Neuro:  Alert and Oriented x 3, Strength and sensation are intact Psych: euthymic mood, full affect  Wt Readings from Last 3 Encounters:  10/14/17 154 lb 3.2 oz (69.9 kg)  05/04/17 155 lb (70.3 kg)  04/10/17 154 lb (69.9 kg)      Studies/Labs Reviewed:   EKG:  EKG atrial paced rhythm, right bundle branch block, low voltage on tracing performed in October 2018.  Recent Labs: No results found for requested labs within last 8760 hours.   Lipid Panel No results found for: CHOL, TRIG, HDL, CHOLHDL, VLDL, LDLCALC, LDLDIRECT  Additional studies/ records that were reviewed today include:  Creatinine 1.76 in June 2018.  No more recent laboratory data is available    ASSESSMENT:    1. CAD of autologous artery bypass graft without angina   2. Paroxysmal atrial fibrillation (HCC)   3. Benign hypertension with chronic kidney disease, stage IV (HCC)   4. Abdominal aortic aneurysm (AAA) without rupture ([place])   5. Pacemaker-St.Jude   6. Sinus node dysfunction (HCC)      PLAN:  In order of problems listed above:  1. He denies angina.  No evaluation or change in therapy needed. 2. Continue current therapy.  Was in atrial paced rhythm when seen in pacemaker clinic in October. 3. Check basic metabolic panel today. 4. Followed at  vascular vein specialist of [place].  Status post stent graft repair. 5. Normal function when last evaluated.  Clinical  follow-up in 1 year.  Basic metabolic panel to track kidney function and make adjustments of medication if necessary.    Medication Adjustments/Labs and Tests Ordered: Current medicines are reviewed at length with the patient today.  Concerns regarding medicines are outlined above.  Medication changes, Labs and Tests ordered today are listed in the Patient Instructions below. There are no Patient Instructions on file for this visit.   Signed, Sinclair Grooms, MD  10/14/2017 10:32 AM    [facility] [address], [place], [place]  [address] Phone: [phone]; Fax: [phone]

## 2017-10-20 ENCOUNTER — Ambulatory Visit (INDEPENDENT_AMBULATORY_CARE_PROVIDER_SITE_OTHER): Payer: Medicare Other | Admitting: *Deleted

## 2017-10-20 ENCOUNTER — Telehealth: Payer: Self-pay | Admitting: Cardiology

## 2017-10-20 DIAGNOSIS — I495 Sick sinus syndrome: Secondary | ICD-10-CM

## 2017-10-20 NOTE — Telephone Encounter (Signed)
Spoke with pt and reminded pt of remote transmission that is due today. Pt verbalized understanding.   

## 2017-10-21 ENCOUNTER — Encounter: Payer: Self-pay | Admitting: Cardiology

## 2017-10-21 NOTE — Progress Notes (Signed)
Remote pacemaker transmission.   

## 2017-10-27 DIAGNOSIS — I129 Hypertensive chronic kidney disease with stage 1 through stage 4 chronic kidney disease, or unspecified chronic kidney disease: Secondary | ICD-10-CM | POA: Diagnosis not present

## 2017-10-27 DIAGNOSIS — D472 Monoclonal gammopathy: Secondary | ICD-10-CM | POA: Diagnosis not present

## 2017-10-27 DIAGNOSIS — N184 Chronic kidney disease, stage 4 (severe): Secondary | ICD-10-CM | POA: Diagnosis not present

## 2017-10-27 DIAGNOSIS — N2581 Secondary hyperparathyroidism of renal origin: Secondary | ICD-10-CM | POA: Diagnosis not present

## 2017-10-27 DIAGNOSIS — D631 Anemia in chronic kidney disease: Secondary | ICD-10-CM | POA: Diagnosis not present

## 2017-11-06 LAB — CUP PACEART REMOTE DEVICE CHECK
Battery Remaining Longevity: 94 mo
Battery Remaining Percentage: 73 %
Battery Voltage: 2.92 V
Brady Statistic AS VP Percent: 1 %
Brady Statistic AS VS Percent: 2.5 %
Brady Statistic RA Percent Paced: 98 %
Brady Statistic RV Percent Paced: 1 %
Date Time Interrogation Session: 20190507221041
Implantable Lead Implant Date: 20130708
Implantable Lead Location: 753859
Implantable Lead Model: 1948
Implantable Pulse Generator Implant Date: 20130708
Lead Channel Impedance Value: 400 Ohm
Lead Channel Pacing Threshold Amplitude: 0.625 V
Lead Channel Pacing Threshold Pulse Width: 0.4 ms
Lead Channel Pacing Threshold Pulse Width: 0.6 ms
Lead Channel Sensing Intrinsic Amplitude: 8.5 mV
Lead Channel Setting Pacing Amplitude: 1.375
Lead Channel Setting Pacing Amplitude: 1.625
Lead Channel Setting Sensing Sensitivity: 2 mV
MDC IDC LEAD IMPLANT DT: 20130708
MDC IDC LEAD LOCATION: 753860
MDC IDC MSMT LEADCHNL RA SENSING INTR AMPL: 4 mV
MDC IDC MSMT LEADCHNL RV IMPEDANCE VALUE: 490 Ohm
MDC IDC MSMT LEADCHNL RV PACING THRESHOLD AMPLITUDE: 1.125 V
MDC IDC PG SERIAL: 7350984
MDC IDC SET LEADCHNL RV PACING PULSEWIDTH: 0.6 ms
MDC IDC STAT BRADY AP VP PERCENT: 1 %
MDC IDC STAT BRADY AP VS PERCENT: 97 %

## 2018-01-05 DIAGNOSIS — R351 Nocturia: Secondary | ICD-10-CM | POA: Diagnosis not present

## 2018-01-05 DIAGNOSIS — N401 Enlarged prostate with lower urinary tract symptoms: Secondary | ICD-10-CM | POA: Diagnosis not present

## 2018-01-11 DIAGNOSIS — R269 Unspecified abnormalities of gait and mobility: Secondary | ICD-10-CM | POA: Diagnosis not present

## 2018-01-11 DIAGNOSIS — N400 Enlarged prostate without lower urinary tract symptoms: Secondary | ICD-10-CM | POA: Diagnosis not present

## 2018-01-11 DIAGNOSIS — I2510 Atherosclerotic heart disease of native coronary artery without angina pectoris: Secondary | ICD-10-CM | POA: Diagnosis not present

## 2018-01-11 DIAGNOSIS — N289 Disorder of kidney and ureter, unspecified: Secondary | ICD-10-CM | POA: Diagnosis not present

## 2018-01-11 DIAGNOSIS — E782 Mixed hyperlipidemia: Secondary | ICD-10-CM | POA: Diagnosis not present

## 2018-01-11 DIAGNOSIS — Z1389 Encounter for screening for other disorder: Secondary | ICD-10-CM | POA: Diagnosis not present

## 2018-01-11 DIAGNOSIS — I4510 Unspecified right bundle-branch block: Secondary | ICD-10-CM | POA: Diagnosis not present

## 2018-01-11 DIAGNOSIS — Z79899 Other long term (current) drug therapy: Secondary | ICD-10-CM | POA: Diagnosis not present

## 2018-01-11 DIAGNOSIS — H6123 Impacted cerumen, bilateral: Secondary | ICD-10-CM | POA: Diagnosis not present

## 2018-01-11 DIAGNOSIS — G609 Hereditary and idiopathic neuropathy, unspecified: Secondary | ICD-10-CM | POA: Diagnosis not present

## 2018-01-11 DIAGNOSIS — E559 Vitamin D deficiency, unspecified: Secondary | ICD-10-CM | POA: Diagnosis not present

## 2018-01-11 DIAGNOSIS — Z0000 Encounter for general adult medical examination without abnormal findings: Secondary | ICD-10-CM | POA: Diagnosis not present

## 2018-01-11 DIAGNOSIS — Z950 Presence of cardiac pacemaker: Secondary | ICD-10-CM | POA: Diagnosis not present

## 2018-01-11 DIAGNOSIS — I10 Essential (primary) hypertension: Secondary | ICD-10-CM | POA: Diagnosis not present

## 2018-01-19 ENCOUNTER — Encounter: Payer: Medicare Other | Admitting: *Deleted

## 2018-01-26 ENCOUNTER — Encounter: Payer: Self-pay | Admitting: Cardiology

## 2018-01-26 NOTE — Progress Notes (Signed)
Letter  

## 2018-02-04 DIAGNOSIS — N184 Chronic kidney disease, stage 4 (severe): Secondary | ICD-10-CM | POA: Diagnosis not present

## 2018-02-04 DIAGNOSIS — H9190 Unspecified hearing loss, unspecified ear: Secondary | ICD-10-CM | POA: Diagnosis not present

## 2018-02-04 DIAGNOSIS — F0390 Unspecified dementia without behavioral disturbance: Secondary | ICD-10-CM | POA: Diagnosis not present

## 2018-03-02 DIAGNOSIS — I129 Hypertensive chronic kidney disease with stage 1 through stage 4 chronic kidney disease, or unspecified chronic kidney disease: Secondary | ICD-10-CM | POA: Diagnosis not present

## 2018-03-02 DIAGNOSIS — D631 Anemia in chronic kidney disease: Secondary | ICD-10-CM | POA: Diagnosis not present

## 2018-03-02 DIAGNOSIS — N184 Chronic kidney disease, stage 4 (severe): Secondary | ICD-10-CM | POA: Diagnosis not present

## 2018-03-02 DIAGNOSIS — N2581 Secondary hyperparathyroidism of renal origin: Secondary | ICD-10-CM | POA: Diagnosis not present

## 2018-03-02 DIAGNOSIS — Z9889 Other specified postprocedural states: Secondary | ICD-10-CM | POA: Diagnosis not present

## 2018-03-02 DIAGNOSIS — D472 Monoclonal gammopathy: Secondary | ICD-10-CM | POA: Diagnosis not present

## 2018-03-13 DIAGNOSIS — Z23 Encounter for immunization: Secondary | ICD-10-CM | POA: Diagnosis not present

## 2018-04-02 ENCOUNTER — Encounter: Payer: Self-pay | Admitting: Physician Assistant

## 2018-04-07 ENCOUNTER — Telehealth: Payer: Self-pay | Admitting: Interventional Cardiology

## 2018-04-07 NOTE — Telephone Encounter (Signed)
Spoke with pt's dtr (DPR on file). Explained why pt sees Brabham, Tamala Julian & Caryl Comes.  Dtr now has better understanding of why pt sees each physician. She is aware of pt's yearly scheduled f/u on 11/6 w/ Ursuy to check his device. She will call Brabham's office to discuss his follow up with them. She thanks me for speaking with her and explaining things.

## 2018-04-07 NOTE — Telephone Encounter (Signed)
New Message    Patients daughter Edward Cannon is calling on his behalf (DPR is on file). She has questions in reference to coordination of care between Dr. Tamala Julian and Dr. Trula Slade where she believes is some conflicting information. Please call to discuss.

## 2018-04-18 NOTE — Progress Notes (Signed)
Cardiology Office Note Date:  04/21/2018  Patient ID:  Edward Cannon 08/27/1925, MRN [id number] PCP:  Josetta Huddle, MD  Cardiologist:  Dr. Tamala Julian Electrophysiologist: Dr. Caryl Comes   Chief Complaint: annual EP visit  History of Present Illness: Edward Cannon is a 84 y.o. male with history of sinus node dysfunction w/PPM, CAD (CABG, RIMA to LAD, LIMA to OM, and SVG to PDA), AFib, HTN, HLD, AAA, follows w/vascular vein specialist of [place], s/p stent graft repair.  He comes in today to be seen for Dr. Caryl Comes.  Last seen by him Oct 2018.  Dr. Caryl Comes mentions noted infrequent episodes of repetitive non-reentrant AV synchrony, no re-programming was done.  He makes mention of AFib listed for the patient, though to date, not seen by the device, not on a/c.  He is accompanied by his wfe.  He uses a wheelchair 2/2 persistent myopathy/weakness (post statin per the patient/wife), though is able to ambulate, legs get tired quickly.  He rides a stationary bike a few minutes 2x per day for exercise.  No CP, palpitations or SOB, no dizziness, near syncope or syncope.    Device information: SJM dual chamber PPM, implanted 12/22/11  Past Medical History:  Diagnosis Date  . AAA (abdominal aortic aneurysm) ([place]) 10/04/14   Per  Dr. Viviana Simpler  . Abnormality of gait 10/09/2014  . Anginal pain ([place]) 2005  . Atrial fibrillation ([place])   . Bouchard nodes (DJD hand)   . BPH (benign prostatic hypertrophy)   . Coronary artery disease S/P CABG   . Diverticulosis    Wtih Anatomotic friability on colonscopy  . H/O scarlet fever 1936  . History of bronchitis winter 2012  . Hx of colonic polyps   . Hyperlipidemia   . Hypertension   . Leg weakness, bilateral 10/09/2014  . Microscopic hematuria   . 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 Pacemaker   . Sinus node dysfunction   . Syncope    Recurrent, second to Bradycardia  . Urinary urgency    With Incontinence  .  Ventral hernia    Asymptomatic    Past Surgical History:  Procedure Laterality Date  . ABDOMINAL AORTIC ENDOVASCULAR FENESTRATED STENT GRAFT N/A 02/15/2015   Procedure: ABDOMINAL AORTIC ENDOVASCULAR FENESTRATED STENT GRAFT;  Surgeon: Serafina Mitchell, MD;  Location: [facility];  Service: Vascular;  Laterality: N/A;  . CARDIAC CATHETERIZATION    . CATARACT EXTRACTION Bilateral   . COLON RESECTION  ~ 2005  . COLON SURGERY    . CORONARY ARTERY BYPASS GRAFT  ~ 2009   CABG X3  . EYE SURGERY Bilateral 2012  . INSERT / REPLACE / REMOVE PACEMAKER  12/22/11  . LIMA     Obtuse marginal branch of the left circumflex   . LOW ANTERIOR BOWEL RESECTION     for large villous  . MASTOIDECTOMY  1930  . PERIPHERAL VASCULAR CATHETERIZATION N/A 05/27/2016   Procedure: Abdominal Aortogram w/Lower Extremity;  Surgeon: Serafina Mitchell, MD;  Location: [facility] CV [facility];  Service: Cardiovascular;  Laterality: N/A;  . PERMANENT PACEMAKER INSERTION N/A 12/22/2011   Procedure: PERMANENT PACEMAKER INSERTION;  Surgeon: Deboraha Sprang, MD;  Location: [facility] CATH LAB;  Service: Cardiovascular;  Laterality: N/A;  . RENAL ARTERY STENT Left 02/15/2015   Stenting and angioplasty of left renal artery  . RIMA     to the LAD  . SVG  2005   to posterior lateral branch of  the RCA    Current Outpatient Medications  Medication Sig Dispense Refill  . amLODipine (NORVASC) 5 MG tablet Take 5 mg by mouth daily.     . Aspirin 81 MG EC tablet Take 40.5 mg by mouth every evening.     . cholecalciferol (VITAMIN D) 1000 units tablet Take 1,000 Units by mouth daily.    Marland Kitchen donepezil (ARICEPT) 5 MG tablet Take 5 mg by mouth at bedtime.  2  . finasteride (PROSCAR) 5 MG tablet Take 5 mg by mouth daily.     . nitroGLYCERIN (NITROSTAT) 0.4 MG SL tablet Place 0.4 mg under the tongue every 5 (five) minutes x 3 doses as needed. For chest pain    . Omega-3 Fatty Acids (FISH OIL) 1000 MG CAPS Take 1,000 mg by mouth daily.    . Tamsulosin HCl (FLOMAX) 0.4  MG CAPS Take 0.4 mg by mouth daily.     . valsartan-hydrochlorothiazide (DIOVAN-HCT) 160-25 MG per tablet Take 0.5 tablets by mouth daily.     . vitamin E (VITAMIN E) 400 UNIT capsule Take 400 Units by mouth every morning.     . vitamin k 100 MCG tablet Take 50 mcg by mouth every Monday.      No current facility-administered medications for this visit.     Allergies:   Crestor [rosuvastatin]; Hygroton [chlorthalidone]; Lipitor [atorvastatin]; Metamucil [psyllium]; Mevacor [lovastatin]; Pravastatin; Vytorin [ezetimibe-simvastatin]; Zetia [ezetimibe]; and Toviaz [fesoterodine fumarate er]   Social History:  The patient  reports that he has never smoked. He has never used smokeless tobacco. He reports that he does not drink alcohol or use drugs.   Family History:  The patient's family history includes CAD in his mother; COPD in his father; Coronary artery disease in his brother; Diabetes in his mother; Heart disease in his mother; Heart failure in his brother; Other in his brother; Pancreatic cancer in his brother; Prostate cancer in his father.  ROS:  Please see the history of present illness.   All other systems are reviewed and otherwise negative.   PHYSICAL EXAM:  VS:  BP 128/68   Pulse 68   Ht 5\' 6"  (1.676 m)   Wt 151 lb (68.5 kg)   SpO2 99%   BMI 24.37 kg/m  BMI: Body mass index is 24.37 kg/m. Well nourished, well developed, in no acute distress  HEENT: normocephalic, atraumatic  Neck: no JVD, carotid bruits or masses Cardiac:  RRR; no significant murmurs, no rubs, or gallops Lungs:  CTA b/l, no wheezing, rhonchi or rales  Abd: soft, nontender MS: no deformity, age appropriate atrophy Ext: no edema  Skin: warm and dry, no rash Neuro:  No gross deficits appreciated Psych: euthymic mood, full affect  PPM site is stable, no tethering or discomfort   EKG:  Done today and reviewed by myself AP/V sensed rhythm PPM interrogation done today and reviewed by myself: battery and  lead measurements are good, underlying SB 40's, 31 AMS episodes, are false, 98%AP, <1% VP   11/05/13: Stress myoview  There is no resting or stress perfusion defect. This study is normal, there is no evidence of prior scar or ischemia.  EF 66%     Recent Labs: 10/14/2017: BUN 43; Creatinine, Ser 2.82; Potassium 4.3; Sodium 138  No results found for requested labs within last 8760 hours.   CrCl cannot be calculated (Patient's most recent lab result is older than the maximum 21 days allowed.).   Wt Readings from Last 3 Encounters:  04/21/18 151 lb (  68.5 kg)  10/14/17 154 lb 3.2 oz (69.9 kg)  05/04/17 155 lb (70.3 kg)     Other studies reviewed: Additional studies/records reviewed today include: summarized above  ASSESSMENT AND PLAN:  1. PPM     intact function  2. CAD     No anginal symptoms     On ASA, no statin with intolerance, reports of statin myalgias     C/w Dr. Tamala Julian     Will update his echo given CAD hx  3. HTN     Looks OK, no changes  4. HLD     Not on statin as noted above     Reports healthy eating habits     Disposition: F/u with device clinic check in 77mo, return EP OV in 1 year, sooner if needed.      Current medicines are reviewed at length with the patient today.  The patient did not have any concerns regarding medicines.  Venetia Night, PA-C 04/21/2018 1:03 PM     [address] [place] [address] [phone] (office)  [phone] (fax)

## 2018-04-21 ENCOUNTER — Ambulatory Visit (INDEPENDENT_AMBULATORY_CARE_PROVIDER_SITE_OTHER): Payer: Medicare Other | Admitting: Physician Assistant

## 2018-04-21 ENCOUNTER — Encounter (INDEPENDENT_AMBULATORY_CARE_PROVIDER_SITE_OTHER): Payer: Self-pay

## 2018-04-21 VITALS — BP 128/68 | HR 68 | Ht 66.0 in | Wt 151.0 lb

## 2018-04-21 DIAGNOSIS — I10 Essential (primary) hypertension: Secondary | ICD-10-CM

## 2018-04-21 DIAGNOSIS — I495 Sick sinus syndrome: Secondary | ICD-10-CM

## 2018-04-21 DIAGNOSIS — I25810 Atherosclerosis of coronary artery bypass graft(s) without angina pectoris: Secondary | ICD-10-CM

## 2018-04-21 DIAGNOSIS — Z950 Presence of cardiac pacemaker: Secondary | ICD-10-CM | POA: Diagnosis not present

## 2018-04-21 DIAGNOSIS — I2510 Atherosclerotic heart disease of native coronary artery without angina pectoris: Secondary | ICD-10-CM

## 2018-04-21 DIAGNOSIS — E785 Hyperlipidemia, unspecified: Secondary | ICD-10-CM | POA: Diagnosis not present

## 2018-04-21 NOTE — Patient Instructions (Signed)
Medication Instructions:   Your physician recommends that you continue on your current medications as directed. Please refer to the Current Medication list given to you today.   If you need a refill on your cardiac medications before your next appointment, please call your pharmacy.  Labwork:NONE ORDERED  TODAY    Testing/Procedures: Your physician has requested that you have an echocardiogram. Echocardiography is a painless test that uses sound waves to create images of your heart. It provides your doctor with information about the size and shape of your heart and how well your heart's chambers and valves are working. This procedure takes approximately one hour. There are no restrictions for this procedure.     Follow-Up: Your physician wants you to follow-up in:  IN  [place] will receive a reminder letter in the mail two months in advance. If you don't receive a letter, please call our office to schedule the follow-up appointment.  Your physician wants you to follow-up in: [place] will receive a reminder letter in the mail two months in advance. If you don't receive a letter, please call our office to schedule the follow-up appointment.     Any Other Special Instructions Will Be Listed Below (If Applicable).

## 2018-04-22 NOTE — Addendum Note (Signed)
Addended by: Claude Manges on: 04/22/2018 02:49 PM   Modules accepted: Orders

## 2018-06-30 DIAGNOSIS — D472 Monoclonal gammopathy: Secondary | ICD-10-CM | POA: Diagnosis not present

## 2018-06-30 DIAGNOSIS — N184 Chronic kidney disease, stage 4 (severe): Secondary | ICD-10-CM | POA: Diagnosis not present

## 2018-06-30 DIAGNOSIS — I129 Hypertensive chronic kidney disease with stage 1 through stage 4 chronic kidney disease, or unspecified chronic kidney disease: Secondary | ICD-10-CM | POA: Diagnosis not present

## 2018-06-30 DIAGNOSIS — N2581 Secondary hyperparathyroidism of renal origin: Secondary | ICD-10-CM | POA: Diagnosis not present

## 2018-06-30 DIAGNOSIS — D631 Anemia in chronic kidney disease: Secondary | ICD-10-CM | POA: Diagnosis not present

## 2018-07-01 NOTE — Progress Notes (Signed)
No letter needed 

## 2018-07-13 DIAGNOSIS — I4891 Unspecified atrial fibrillation: Secondary | ICD-10-CM | POA: Diagnosis not present

## 2018-07-13 DIAGNOSIS — G609 Hereditary and idiopathic neuropathy, unspecified: Secondary | ICD-10-CM | POA: Diagnosis not present

## 2018-07-13 DIAGNOSIS — K219 Gastro-esophageal reflux disease without esophagitis: Secondary | ICD-10-CM | POA: Diagnosis not present

## 2018-07-13 DIAGNOSIS — N184 Chronic kidney disease, stage 4 (severe): Secondary | ICD-10-CM | POA: Diagnosis not present

## 2018-07-13 DIAGNOSIS — H6123 Impacted cerumen, bilateral: Secondary | ICD-10-CM | POA: Diagnosis not present

## 2018-07-13 DIAGNOSIS — Z79899 Other long term (current) drug therapy: Secondary | ICD-10-CM | POA: Diagnosis not present

## 2018-07-13 DIAGNOSIS — I2510 Atherosclerotic heart disease of native coronary artery without angina pectoris: Secondary | ICD-10-CM | POA: Diagnosis not present

## 2018-07-13 DIAGNOSIS — E782 Mixed hyperlipidemia: Secondary | ICD-10-CM | POA: Diagnosis not present

## 2018-07-13 DIAGNOSIS — H9190 Unspecified hearing loss, unspecified ear: Secondary | ICD-10-CM | POA: Diagnosis not present

## 2018-07-13 DIAGNOSIS — F0390 Unspecified dementia without behavioral disturbance: Secondary | ICD-10-CM | POA: Diagnosis not present

## 2018-07-13 DIAGNOSIS — R3915 Urgency of urination: Secondary | ICD-10-CM | POA: Diagnosis not present

## 2018-07-13 DIAGNOSIS — E559 Vitamin D deficiency, unspecified: Secondary | ICD-10-CM | POA: Diagnosis not present

## 2018-08-30 DIAGNOSIS — H9190 Unspecified hearing loss, unspecified ear: Secondary | ICD-10-CM | POA: Diagnosis not present

## 2018-08-30 DIAGNOSIS — Z9181 History of falling: Secondary | ICD-10-CM | POA: Diagnosis not present

## 2018-08-30 DIAGNOSIS — G9589 Other specified diseases of spinal cord: Secondary | ICD-10-CM | POA: Diagnosis not present

## 2018-08-30 DIAGNOSIS — M19042 Primary osteoarthritis, left hand: Secondary | ICD-10-CM | POA: Diagnosis not present

## 2018-08-30 DIAGNOSIS — N184 Chronic kidney disease, stage 4 (severe): Secondary | ICD-10-CM | POA: Diagnosis not present

## 2018-08-30 DIAGNOSIS — E559 Vitamin D deficiency, unspecified: Secondary | ICD-10-CM | POA: Diagnosis not present

## 2018-08-30 DIAGNOSIS — N401 Enlarged prostate with lower urinary tract symptoms: Secondary | ICD-10-CM | POA: Diagnosis not present

## 2018-08-30 DIAGNOSIS — R3915 Urgency of urination: Secondary | ICD-10-CM | POA: Diagnosis not present

## 2018-08-30 DIAGNOSIS — M19041 Primary osteoarthritis, right hand: Secondary | ICD-10-CM | POA: Diagnosis not present

## 2018-08-30 DIAGNOSIS — Z951 Presence of aortocoronary bypass graft: Secondary | ICD-10-CM | POA: Diagnosis not present

## 2018-08-30 DIAGNOSIS — K219 Gastro-esophageal reflux disease without esophagitis: Secondary | ICD-10-CM | POA: Diagnosis not present

## 2018-08-30 DIAGNOSIS — G629 Polyneuropathy, unspecified: Secondary | ICD-10-CM | POA: Diagnosis not present

## 2018-08-30 DIAGNOSIS — H6120 Impacted cerumen, unspecified ear: Secondary | ICD-10-CM | POA: Diagnosis not present

## 2018-08-30 DIAGNOSIS — E782 Mixed hyperlipidemia: Secondary | ICD-10-CM | POA: Diagnosis not present

## 2018-08-30 DIAGNOSIS — I2510 Atherosclerotic heart disease of native coronary artery without angina pectoris: Secondary | ICD-10-CM | POA: Diagnosis not present

## 2018-08-30 DIAGNOSIS — K5790 Diverticulosis of intestine, part unspecified, without perforation or abscess without bleeding: Secondary | ICD-10-CM | POA: Diagnosis not present

## 2018-08-30 DIAGNOSIS — I4891 Unspecified atrial fibrillation: Secondary | ICD-10-CM | POA: Diagnosis not present

## 2018-08-30 DIAGNOSIS — Z7982 Long term (current) use of aspirin: Secondary | ICD-10-CM | POA: Diagnosis not present

## 2018-08-30 DIAGNOSIS — I129 Hypertensive chronic kidney disease with stage 1 through stage 4 chronic kidney disease, or unspecified chronic kidney disease: Secondary | ICD-10-CM | POA: Diagnosis not present

## 2018-08-30 DIAGNOSIS — Z950 Presence of cardiac pacemaker: Secondary | ICD-10-CM | POA: Diagnosis not present

## 2018-08-30 DIAGNOSIS — F0280 Dementia in other diseases classified elsewhere without behavioral disturbance: Secondary | ICD-10-CM | POA: Diagnosis not present

## 2018-09-02 DIAGNOSIS — G9589 Other specified diseases of spinal cord: Secondary | ICD-10-CM | POA: Diagnosis not present

## 2018-09-02 DIAGNOSIS — F0280 Dementia in other diseases classified elsewhere without behavioral disturbance: Secondary | ICD-10-CM | POA: Diagnosis not present

## 2018-09-02 DIAGNOSIS — G629 Polyneuropathy, unspecified: Secondary | ICD-10-CM | POA: Diagnosis not present

## 2018-09-02 DIAGNOSIS — I2510 Atherosclerotic heart disease of native coronary artery without angina pectoris: Secondary | ICD-10-CM | POA: Diagnosis not present

## 2018-09-02 DIAGNOSIS — I129 Hypertensive chronic kidney disease with stage 1 through stage 4 chronic kidney disease, or unspecified chronic kidney disease: Secondary | ICD-10-CM | POA: Diagnosis not present

## 2018-09-02 DIAGNOSIS — N184 Chronic kidney disease, stage 4 (severe): Secondary | ICD-10-CM | POA: Diagnosis not present

## 2018-09-06 DIAGNOSIS — N184 Chronic kidney disease, stage 4 (severe): Secondary | ICD-10-CM | POA: Diagnosis not present

## 2018-09-06 DIAGNOSIS — G9589 Other specified diseases of spinal cord: Secondary | ICD-10-CM | POA: Diagnosis not present

## 2018-09-06 DIAGNOSIS — G629 Polyneuropathy, unspecified: Secondary | ICD-10-CM | POA: Diagnosis not present

## 2018-09-06 DIAGNOSIS — I2510 Atherosclerotic heart disease of native coronary artery without angina pectoris: Secondary | ICD-10-CM | POA: Diagnosis not present

## 2018-09-06 DIAGNOSIS — F0280 Dementia in other diseases classified elsewhere without behavioral disturbance: Secondary | ICD-10-CM | POA: Diagnosis not present

## 2018-09-06 DIAGNOSIS — I129 Hypertensive chronic kidney disease with stage 1 through stage 4 chronic kidney disease, or unspecified chronic kidney disease: Secondary | ICD-10-CM | POA: Diagnosis not present

## 2018-09-08 DIAGNOSIS — G629 Polyneuropathy, unspecified: Secondary | ICD-10-CM | POA: Diagnosis not present

## 2018-09-08 DIAGNOSIS — I129 Hypertensive chronic kidney disease with stage 1 through stage 4 chronic kidney disease, or unspecified chronic kidney disease: Secondary | ICD-10-CM | POA: Diagnosis not present

## 2018-09-08 DIAGNOSIS — G9589 Other specified diseases of spinal cord: Secondary | ICD-10-CM | POA: Diagnosis not present

## 2018-09-08 DIAGNOSIS — N184 Chronic kidney disease, stage 4 (severe): Secondary | ICD-10-CM | POA: Diagnosis not present

## 2018-09-08 DIAGNOSIS — F0280 Dementia in other diseases classified elsewhere without behavioral disturbance: Secondary | ICD-10-CM | POA: Diagnosis not present

## 2018-09-08 DIAGNOSIS — I2510 Atherosclerotic heart disease of native coronary artery without angina pectoris: Secondary | ICD-10-CM | POA: Diagnosis not present

## 2018-09-13 DIAGNOSIS — G9589 Other specified diseases of spinal cord: Secondary | ICD-10-CM | POA: Diagnosis not present

## 2018-09-13 DIAGNOSIS — N184 Chronic kidney disease, stage 4 (severe): Secondary | ICD-10-CM | POA: Diagnosis not present

## 2018-09-13 DIAGNOSIS — I129 Hypertensive chronic kidney disease with stage 1 through stage 4 chronic kidney disease, or unspecified chronic kidney disease: Secondary | ICD-10-CM | POA: Diagnosis not present

## 2018-09-13 DIAGNOSIS — I2510 Atherosclerotic heart disease of native coronary artery without angina pectoris: Secondary | ICD-10-CM | POA: Diagnosis not present

## 2018-09-13 DIAGNOSIS — G629 Polyneuropathy, unspecified: Secondary | ICD-10-CM | POA: Diagnosis not present

## 2018-09-13 DIAGNOSIS — F0280 Dementia in other diseases classified elsewhere without behavioral disturbance: Secondary | ICD-10-CM | POA: Diagnosis not present

## 2018-09-15 DIAGNOSIS — N184 Chronic kidney disease, stage 4 (severe): Secondary | ICD-10-CM | POA: Diagnosis not present

## 2018-09-15 DIAGNOSIS — I129 Hypertensive chronic kidney disease with stage 1 through stage 4 chronic kidney disease, or unspecified chronic kidney disease: Secondary | ICD-10-CM | POA: Diagnosis not present

## 2018-09-15 DIAGNOSIS — G9589 Other specified diseases of spinal cord: Secondary | ICD-10-CM | POA: Diagnosis not present

## 2018-09-15 DIAGNOSIS — I2510 Atherosclerotic heart disease of native coronary artery without angina pectoris: Secondary | ICD-10-CM | POA: Diagnosis not present

## 2018-09-15 DIAGNOSIS — G629 Polyneuropathy, unspecified: Secondary | ICD-10-CM | POA: Diagnosis not present

## 2018-09-15 DIAGNOSIS — F0280 Dementia in other diseases classified elsewhere without behavioral disturbance: Secondary | ICD-10-CM | POA: Diagnosis not present

## 2018-09-20 DIAGNOSIS — N184 Chronic kidney disease, stage 4 (severe): Secondary | ICD-10-CM | POA: Diagnosis not present

## 2018-09-20 DIAGNOSIS — F0280 Dementia in other diseases classified elsewhere without behavioral disturbance: Secondary | ICD-10-CM | POA: Diagnosis not present

## 2018-09-20 DIAGNOSIS — I2510 Atherosclerotic heart disease of native coronary artery without angina pectoris: Secondary | ICD-10-CM | POA: Diagnosis not present

## 2018-09-20 DIAGNOSIS — G9589 Other specified diseases of spinal cord: Secondary | ICD-10-CM | POA: Diagnosis not present

## 2018-09-20 DIAGNOSIS — I129 Hypertensive chronic kidney disease with stage 1 through stage 4 chronic kidney disease, or unspecified chronic kidney disease: Secondary | ICD-10-CM | POA: Diagnosis not present

## 2018-09-20 DIAGNOSIS — G629 Polyneuropathy, unspecified: Secondary | ICD-10-CM | POA: Diagnosis not present

## 2018-09-23 DIAGNOSIS — G629 Polyneuropathy, unspecified: Secondary | ICD-10-CM | POA: Diagnosis not present

## 2018-09-23 DIAGNOSIS — I2510 Atherosclerotic heart disease of native coronary artery without angina pectoris: Secondary | ICD-10-CM | POA: Diagnosis not present

## 2018-09-23 DIAGNOSIS — F0280 Dementia in other diseases classified elsewhere without behavioral disturbance: Secondary | ICD-10-CM | POA: Diagnosis not present

## 2018-09-23 DIAGNOSIS — N184 Chronic kidney disease, stage 4 (severe): Secondary | ICD-10-CM | POA: Diagnosis not present

## 2018-09-23 DIAGNOSIS — G9589 Other specified diseases of spinal cord: Secondary | ICD-10-CM | POA: Diagnosis not present

## 2018-09-23 DIAGNOSIS — I129 Hypertensive chronic kidney disease with stage 1 through stage 4 chronic kidney disease, or unspecified chronic kidney disease: Secondary | ICD-10-CM | POA: Diagnosis not present

## 2018-09-29 DIAGNOSIS — H6120 Impacted cerumen, unspecified ear: Secondary | ICD-10-CM | POA: Diagnosis not present

## 2018-09-29 DIAGNOSIS — Z951 Presence of aortocoronary bypass graft: Secondary | ICD-10-CM | POA: Diagnosis not present

## 2018-09-29 DIAGNOSIS — N184 Chronic kidney disease, stage 4 (severe): Secondary | ICD-10-CM | POA: Diagnosis not present

## 2018-09-29 DIAGNOSIS — H9190 Unspecified hearing loss, unspecified ear: Secondary | ICD-10-CM | POA: Diagnosis not present

## 2018-09-29 DIAGNOSIS — K5790 Diverticulosis of intestine, part unspecified, without perforation or abscess without bleeding: Secondary | ICD-10-CM | POA: Diagnosis not present

## 2018-09-29 DIAGNOSIS — G629 Polyneuropathy, unspecified: Secondary | ICD-10-CM | POA: Diagnosis not present

## 2018-09-29 DIAGNOSIS — E559 Vitamin D deficiency, unspecified: Secondary | ICD-10-CM | POA: Diagnosis not present

## 2018-09-29 DIAGNOSIS — I4891 Unspecified atrial fibrillation: Secondary | ICD-10-CM | POA: Diagnosis not present

## 2018-09-29 DIAGNOSIS — N401 Enlarged prostate with lower urinary tract symptoms: Secondary | ICD-10-CM | POA: Diagnosis not present

## 2018-09-29 DIAGNOSIS — M19041 Primary osteoarthritis, right hand: Secondary | ICD-10-CM | POA: Diagnosis not present

## 2018-09-29 DIAGNOSIS — Z7982 Long term (current) use of aspirin: Secondary | ICD-10-CM | POA: Diagnosis not present

## 2018-09-29 DIAGNOSIS — M19042 Primary osteoarthritis, left hand: Secondary | ICD-10-CM | POA: Diagnosis not present

## 2018-09-29 DIAGNOSIS — I129 Hypertensive chronic kidney disease with stage 1 through stage 4 chronic kidney disease, or unspecified chronic kidney disease: Secondary | ICD-10-CM | POA: Diagnosis not present

## 2018-09-29 DIAGNOSIS — E782 Mixed hyperlipidemia: Secondary | ICD-10-CM | POA: Diagnosis not present

## 2018-09-29 DIAGNOSIS — G9589 Other specified diseases of spinal cord: Secondary | ICD-10-CM | POA: Diagnosis not present

## 2018-09-29 DIAGNOSIS — I2510 Atherosclerotic heart disease of native coronary artery without angina pectoris: Secondary | ICD-10-CM | POA: Diagnosis not present

## 2018-09-29 DIAGNOSIS — Z950 Presence of cardiac pacemaker: Secondary | ICD-10-CM | POA: Diagnosis not present

## 2018-09-29 DIAGNOSIS — Z9181 History of falling: Secondary | ICD-10-CM | POA: Diagnosis not present

## 2018-09-29 DIAGNOSIS — F0280 Dementia in other diseases classified elsewhere without behavioral disturbance: Secondary | ICD-10-CM | POA: Diagnosis not present

## 2018-09-29 DIAGNOSIS — R3915 Urgency of urination: Secondary | ICD-10-CM | POA: Diagnosis not present

## 2018-09-29 DIAGNOSIS — K219 Gastro-esophageal reflux disease without esophagitis: Secondary | ICD-10-CM | POA: Diagnosis not present

## 2018-10-01 DIAGNOSIS — G629 Polyneuropathy, unspecified: Secondary | ICD-10-CM | POA: Diagnosis not present

## 2018-10-01 DIAGNOSIS — I129 Hypertensive chronic kidney disease with stage 1 through stage 4 chronic kidney disease, or unspecified chronic kidney disease: Secondary | ICD-10-CM | POA: Diagnosis not present

## 2018-10-01 DIAGNOSIS — F0280 Dementia in other diseases classified elsewhere without behavioral disturbance: Secondary | ICD-10-CM | POA: Diagnosis not present

## 2018-10-01 DIAGNOSIS — G9589 Other specified diseases of spinal cord: Secondary | ICD-10-CM | POA: Diagnosis not present

## 2018-10-01 DIAGNOSIS — I2510 Atherosclerotic heart disease of native coronary artery without angina pectoris: Secondary | ICD-10-CM | POA: Diagnosis not present

## 2018-10-01 DIAGNOSIS — N184 Chronic kidney disease, stage 4 (severe): Secondary | ICD-10-CM | POA: Diagnosis not present

## 2018-10-05 ENCOUNTER — Other Ambulatory Visit: Payer: Self-pay | Admitting: Internal Medicine

## 2018-10-14 ENCOUNTER — Ambulatory Visit: Payer: Medicare Other | Admitting: Interventional Cardiology

## 2018-10-15 NOTE — Telephone Encounter (Signed)
LMOVM for pt daughter to return call in regards to MyChart message and rescheduling appts. Pt needs to reschedule his in office pace maker check and his echocardiogram that is coming up on 5/13.

## 2018-10-18 ENCOUNTER — Telehealth: Payer: Self-pay

## 2018-10-18 NOTE — Telephone Encounter (Signed)
Pt daughter wants to reschedule the echocardiogram. The pt daughter will be giving you a call back.

## 2018-10-27 ENCOUNTER — Other Ambulatory Visit (HOSPITAL_COMMUNITY): Payer: Medicare Other

## 2018-10-27 DIAGNOSIS — N184 Chronic kidney disease, stage 4 (severe): Secondary | ICD-10-CM | POA: Diagnosis not present

## 2018-10-27 DIAGNOSIS — G629 Polyneuropathy, unspecified: Secondary | ICD-10-CM | POA: Diagnosis not present

## 2018-10-27 DIAGNOSIS — I2510 Atherosclerotic heart disease of native coronary artery without angina pectoris: Secondary | ICD-10-CM | POA: Diagnosis not present

## 2018-10-27 DIAGNOSIS — G9589 Other specified diseases of spinal cord: Secondary | ICD-10-CM | POA: Diagnosis not present

## 2018-10-27 DIAGNOSIS — I129 Hypertensive chronic kidney disease with stage 1 through stage 4 chronic kidney disease, or unspecified chronic kidney disease: Secondary | ICD-10-CM | POA: Diagnosis not present

## 2018-10-27 DIAGNOSIS — F0280 Dementia in other diseases classified elsewhere without behavioral disturbance: Secondary | ICD-10-CM | POA: Diagnosis not present

## 2018-10-29 DIAGNOSIS — K5790 Diverticulosis of intestine, part unspecified, without perforation or abscess without bleeding: Secondary | ICD-10-CM | POA: Diagnosis not present

## 2018-10-29 DIAGNOSIS — I129 Hypertensive chronic kidney disease with stage 1 through stage 4 chronic kidney disease, or unspecified chronic kidney disease: Secondary | ICD-10-CM | POA: Diagnosis not present

## 2018-10-29 DIAGNOSIS — K219 Gastro-esophageal reflux disease without esophagitis: Secondary | ICD-10-CM | POA: Diagnosis not present

## 2018-10-29 DIAGNOSIS — N401 Enlarged prostate with lower urinary tract symptoms: Secondary | ICD-10-CM | POA: Diagnosis not present

## 2018-10-29 DIAGNOSIS — Z7982 Long term (current) use of aspirin: Secondary | ICD-10-CM | POA: Diagnosis not present

## 2018-10-29 DIAGNOSIS — Z950 Presence of cardiac pacemaker: Secondary | ICD-10-CM | POA: Diagnosis not present

## 2018-10-29 DIAGNOSIS — Z9181 History of falling: Secondary | ICD-10-CM | POA: Diagnosis not present

## 2018-10-29 DIAGNOSIS — H9190 Unspecified hearing loss, unspecified ear: Secondary | ICD-10-CM | POA: Diagnosis not present

## 2018-10-29 DIAGNOSIS — I4891 Unspecified atrial fibrillation: Secondary | ICD-10-CM | POA: Diagnosis not present

## 2018-10-29 DIAGNOSIS — G629 Polyneuropathy, unspecified: Secondary | ICD-10-CM | POA: Diagnosis not present

## 2018-10-29 DIAGNOSIS — Z951 Presence of aortocoronary bypass graft: Secondary | ICD-10-CM | POA: Diagnosis not present

## 2018-10-29 DIAGNOSIS — R3915 Urgency of urination: Secondary | ICD-10-CM | POA: Diagnosis not present

## 2018-10-29 DIAGNOSIS — N184 Chronic kidney disease, stage 4 (severe): Secondary | ICD-10-CM | POA: Diagnosis not present

## 2018-10-29 DIAGNOSIS — E782 Mixed hyperlipidemia: Secondary | ICD-10-CM | POA: Diagnosis not present

## 2018-10-29 DIAGNOSIS — E559 Vitamin D deficiency, unspecified: Secondary | ICD-10-CM | POA: Diagnosis not present

## 2018-10-29 DIAGNOSIS — I2510 Atherosclerotic heart disease of native coronary artery without angina pectoris: Secondary | ICD-10-CM | POA: Diagnosis not present

## 2018-10-29 DIAGNOSIS — G9589 Other specified diseases of spinal cord: Secondary | ICD-10-CM | POA: Diagnosis not present

## 2018-10-29 DIAGNOSIS — M19042 Primary osteoarthritis, left hand: Secondary | ICD-10-CM | POA: Diagnosis not present

## 2018-10-29 DIAGNOSIS — H6120 Impacted cerumen, unspecified ear: Secondary | ICD-10-CM | POA: Diagnosis not present

## 2018-10-29 DIAGNOSIS — F0280 Dementia in other diseases classified elsewhere without behavioral disturbance: Secondary | ICD-10-CM | POA: Diagnosis not present

## 2018-10-29 DIAGNOSIS — M19041 Primary osteoarthritis, right hand: Secondary | ICD-10-CM | POA: Diagnosis not present

## 2018-11-03 DIAGNOSIS — F0280 Dementia in other diseases classified elsewhere without behavioral disturbance: Secondary | ICD-10-CM | POA: Diagnosis not present

## 2018-11-03 DIAGNOSIS — I129 Hypertensive chronic kidney disease with stage 1 through stage 4 chronic kidney disease, or unspecified chronic kidney disease: Secondary | ICD-10-CM | POA: Diagnosis not present

## 2018-11-03 DIAGNOSIS — N184 Chronic kidney disease, stage 4 (severe): Secondary | ICD-10-CM | POA: Diagnosis not present

## 2018-11-03 DIAGNOSIS — I2510 Atherosclerotic heart disease of native coronary artery without angina pectoris: Secondary | ICD-10-CM | POA: Diagnosis not present

## 2018-11-03 DIAGNOSIS — G9589 Other specified diseases of spinal cord: Secondary | ICD-10-CM | POA: Diagnosis not present

## 2018-11-03 DIAGNOSIS — G629 Polyneuropathy, unspecified: Secondary | ICD-10-CM | POA: Diagnosis not present

## 2018-11-15 DIAGNOSIS — G629 Polyneuropathy, unspecified: Secondary | ICD-10-CM | POA: Diagnosis not present

## 2018-11-15 DIAGNOSIS — G9589 Other specified diseases of spinal cord: Secondary | ICD-10-CM | POA: Diagnosis not present

## 2018-11-15 DIAGNOSIS — N184 Chronic kidney disease, stage 4 (severe): Secondary | ICD-10-CM | POA: Diagnosis not present

## 2018-11-15 DIAGNOSIS — F0280 Dementia in other diseases classified elsewhere without behavioral disturbance: Secondary | ICD-10-CM | POA: Diagnosis not present

## 2018-11-15 DIAGNOSIS — I2510 Atherosclerotic heart disease of native coronary artery without angina pectoris: Secondary | ICD-10-CM | POA: Diagnosis not present

## 2018-11-15 DIAGNOSIS — I129 Hypertensive chronic kidney disease with stage 1 through stage 4 chronic kidney disease, or unspecified chronic kidney disease: Secondary | ICD-10-CM | POA: Diagnosis not present

## 2018-11-25 DIAGNOSIS — G9589 Other specified diseases of spinal cord: Secondary | ICD-10-CM | POA: Diagnosis not present

## 2018-11-25 DIAGNOSIS — I2510 Atherosclerotic heart disease of native coronary artery without angina pectoris: Secondary | ICD-10-CM | POA: Diagnosis not present

## 2018-11-25 DIAGNOSIS — N184 Chronic kidney disease, stage 4 (severe): Secondary | ICD-10-CM | POA: Diagnosis not present

## 2018-11-25 DIAGNOSIS — G629 Polyneuropathy, unspecified: Secondary | ICD-10-CM | POA: Diagnosis not present

## 2018-11-25 DIAGNOSIS — F0280 Dementia in other diseases classified elsewhere without behavioral disturbance: Secondary | ICD-10-CM | POA: Diagnosis not present

## 2018-11-25 DIAGNOSIS — I129 Hypertensive chronic kidney disease with stage 1 through stage 4 chronic kidney disease, or unspecified chronic kidney disease: Secondary | ICD-10-CM | POA: Diagnosis not present

## 2018-11-26 ENCOUNTER — Telehealth: Payer: Self-pay

## 2018-11-26 NOTE — Telephone Encounter (Signed)
I was calling pt to do a covid-19 prescreen. The pt daughter states he has moved to [place] [employer]. She asked if the device clinic appointment has to be in-office or can she just have him send a  Remote transmission. Jenny Reichmann was trying to reschedule the device clinic appointment for 02-02-2019 but was having some difficulty rescheduling that appointment.

## 2018-11-28 DIAGNOSIS — N401 Enlarged prostate with lower urinary tract symptoms: Secondary | ICD-10-CM | POA: Diagnosis not present

## 2018-11-28 DIAGNOSIS — H9190 Unspecified hearing loss, unspecified ear: Secondary | ICD-10-CM | POA: Diagnosis not present

## 2018-11-28 DIAGNOSIS — K5790 Diverticulosis of intestine, part unspecified, without perforation or abscess without bleeding: Secondary | ICD-10-CM | POA: Diagnosis not present

## 2018-11-28 DIAGNOSIS — I4891 Unspecified atrial fibrillation: Secondary | ICD-10-CM | POA: Diagnosis not present

## 2018-11-28 DIAGNOSIS — G9589 Other specified diseases of spinal cord: Secondary | ICD-10-CM | POA: Diagnosis not present

## 2018-11-28 DIAGNOSIS — E782 Mixed hyperlipidemia: Secondary | ICD-10-CM | POA: Diagnosis not present

## 2018-11-28 DIAGNOSIS — R3915 Urgency of urination: Secondary | ICD-10-CM | POA: Diagnosis not present

## 2018-11-28 DIAGNOSIS — M19042 Primary osteoarthritis, left hand: Secondary | ICD-10-CM | POA: Diagnosis not present

## 2018-11-28 DIAGNOSIS — Z951 Presence of aortocoronary bypass graft: Secondary | ICD-10-CM | POA: Diagnosis not present

## 2018-11-28 DIAGNOSIS — Z7982 Long term (current) use of aspirin: Secondary | ICD-10-CM | POA: Diagnosis not present

## 2018-11-28 DIAGNOSIS — Z9181 History of falling: Secondary | ICD-10-CM | POA: Diagnosis not present

## 2018-11-28 DIAGNOSIS — K219 Gastro-esophageal reflux disease without esophagitis: Secondary | ICD-10-CM | POA: Diagnosis not present

## 2018-11-28 DIAGNOSIS — N184 Chronic kidney disease, stage 4 (severe): Secondary | ICD-10-CM | POA: Diagnosis not present

## 2018-11-28 DIAGNOSIS — M19041 Primary osteoarthritis, right hand: Secondary | ICD-10-CM | POA: Diagnosis not present

## 2018-11-28 DIAGNOSIS — H6120 Impacted cerumen, unspecified ear: Secondary | ICD-10-CM | POA: Diagnosis not present

## 2018-11-28 DIAGNOSIS — G629 Polyneuropathy, unspecified: Secondary | ICD-10-CM | POA: Diagnosis not present

## 2018-11-28 DIAGNOSIS — I129 Hypertensive chronic kidney disease with stage 1 through stage 4 chronic kidney disease, or unspecified chronic kidney disease: Secondary | ICD-10-CM | POA: Diagnosis not present

## 2018-11-28 DIAGNOSIS — E559 Vitamin D deficiency, unspecified: Secondary | ICD-10-CM | POA: Diagnosis not present

## 2018-11-28 DIAGNOSIS — I2510 Atherosclerotic heart disease of native coronary artery without angina pectoris: Secondary | ICD-10-CM | POA: Diagnosis not present

## 2018-11-28 DIAGNOSIS — Z950 Presence of cardiac pacemaker: Secondary | ICD-10-CM | POA: Diagnosis not present

## 2018-11-28 DIAGNOSIS — F0280 Dementia in other diseases classified elsewhere without behavioral disturbance: Secondary | ICD-10-CM | POA: Diagnosis not present

## 2018-11-29 ENCOUNTER — Encounter: Payer: Medicare Other | Admitting: *Deleted

## 2018-11-29 DIAGNOSIS — G9589 Other specified diseases of spinal cord: Secondary | ICD-10-CM | POA: Diagnosis not present

## 2018-11-29 DIAGNOSIS — I129 Hypertensive chronic kidney disease with stage 1 through stage 4 chronic kidney disease, or unspecified chronic kidney disease: Secondary | ICD-10-CM | POA: Diagnosis not present

## 2018-11-29 DIAGNOSIS — N184 Chronic kidney disease, stage 4 (severe): Secondary | ICD-10-CM | POA: Diagnosis not present

## 2018-11-29 DIAGNOSIS — I2510 Atherosclerotic heart disease of native coronary artery without angina pectoris: Secondary | ICD-10-CM | POA: Diagnosis not present

## 2018-11-29 DIAGNOSIS — G629 Polyneuropathy, unspecified: Secondary | ICD-10-CM | POA: Diagnosis not present

## 2018-11-29 DIAGNOSIS — F0280 Dementia in other diseases classified elsewhere without behavioral disturbance: Secondary | ICD-10-CM | POA: Diagnosis not present

## 2018-11-29 NOTE — Telephone Encounter (Signed)
Pt was unaware manual transmission was due today.  He will attempt to send. He is not sure if the box is connected to his modem/landline right now or not. He will attempt to send a manual transmission today.    Legrand Como [address] [place], PA-C 11/29/2018 3:25 PM

## 2018-11-29 NOTE — Telephone Encounter (Signed)
Discussed with pts daughter. He has moved into an assisted living apartment and isn't sure how to send a remote. Per her, he has it hooked up to the modem, but isn't sure the specifics. Requesting is it necessary to send today or can it wait for his in person check in August. Will call pt and try to walk through sending a manual.

## 2018-11-30 ENCOUNTER — Telehealth: Payer: Self-pay

## 2018-11-30 NOTE — Telephone Encounter (Signed)
Spoke with patient to remind of missed remote transmission 

## 2018-12-07 ENCOUNTER — Encounter: Payer: Self-pay | Admitting: Cardiology

## 2018-12-07 DIAGNOSIS — N184 Chronic kidney disease, stage 4 (severe): Secondary | ICD-10-CM | POA: Diagnosis not present

## 2018-12-07 DIAGNOSIS — I129 Hypertensive chronic kidney disease with stage 1 through stage 4 chronic kidney disease, or unspecified chronic kidney disease: Secondary | ICD-10-CM | POA: Diagnosis not present

## 2018-12-07 DIAGNOSIS — F0280 Dementia in other diseases classified elsewhere without behavioral disturbance: Secondary | ICD-10-CM | POA: Diagnosis not present

## 2018-12-07 DIAGNOSIS — G629 Polyneuropathy, unspecified: Secondary | ICD-10-CM | POA: Diagnosis not present

## 2018-12-07 DIAGNOSIS — G9589 Other specified diseases of spinal cord: Secondary | ICD-10-CM | POA: Diagnosis not present

## 2018-12-07 DIAGNOSIS — I2510 Atherosclerotic heart disease of native coronary artery without angina pectoris: Secondary | ICD-10-CM | POA: Diagnosis not present

## 2018-12-14 DIAGNOSIS — F0280 Dementia in other diseases classified elsewhere without behavioral disturbance: Secondary | ICD-10-CM | POA: Diagnosis not present

## 2018-12-14 DIAGNOSIS — I2510 Atherosclerotic heart disease of native coronary artery without angina pectoris: Secondary | ICD-10-CM | POA: Diagnosis not present

## 2018-12-14 DIAGNOSIS — N184 Chronic kidney disease, stage 4 (severe): Secondary | ICD-10-CM | POA: Diagnosis not present

## 2018-12-14 DIAGNOSIS — G629 Polyneuropathy, unspecified: Secondary | ICD-10-CM | POA: Diagnosis not present

## 2018-12-14 DIAGNOSIS — G9589 Other specified diseases of spinal cord: Secondary | ICD-10-CM | POA: Diagnosis not present

## 2018-12-14 DIAGNOSIS — I129 Hypertensive chronic kidney disease with stage 1 through stage 4 chronic kidney disease, or unspecified chronic kidney disease: Secondary | ICD-10-CM | POA: Diagnosis not present

## 2018-12-23 DIAGNOSIS — I129 Hypertensive chronic kidney disease with stage 1 through stage 4 chronic kidney disease, or unspecified chronic kidney disease: Secondary | ICD-10-CM | POA: Diagnosis not present

## 2018-12-23 DIAGNOSIS — G9589 Other specified diseases of spinal cord: Secondary | ICD-10-CM | POA: Diagnosis not present

## 2018-12-23 DIAGNOSIS — N184 Chronic kidney disease, stage 4 (severe): Secondary | ICD-10-CM | POA: Diagnosis not present

## 2018-12-23 DIAGNOSIS — G629 Polyneuropathy, unspecified: Secondary | ICD-10-CM | POA: Diagnosis not present

## 2018-12-23 DIAGNOSIS — F0280 Dementia in other diseases classified elsewhere without behavioral disturbance: Secondary | ICD-10-CM | POA: Diagnosis not present

## 2018-12-23 DIAGNOSIS — I2510 Atherosclerotic heart disease of native coronary artery without angina pectoris: Secondary | ICD-10-CM | POA: Diagnosis not present

## 2018-12-24 ENCOUNTER — Ambulatory Visit (INDEPENDENT_AMBULATORY_CARE_PROVIDER_SITE_OTHER): Payer: Medicare Other | Admitting: *Deleted

## 2018-12-24 DIAGNOSIS — I495 Sick sinus syndrome: Secondary | ICD-10-CM

## 2018-12-24 DIAGNOSIS — I480 Paroxysmal atrial fibrillation: Secondary | ICD-10-CM

## 2018-12-27 LAB — CUP PACEART REMOTE DEVICE CHECK
Battery Remaining Longevity: 85 mo
Battery Remaining Percentage: 65 %
Battery Voltage: 2.9 V
Brady Statistic AP VP Percent: 1 %
Brady Statistic AP VS Percent: 98 %
Brady Statistic AS VP Percent: 1 %
Brady Statistic AS VS Percent: 2.1 %
Brady Statistic RA Percent Paced: 98 %
Brady Statistic RV Percent Paced: 1 %
Date Time Interrogation Session: 20200710190849
Implantable Lead Implant Date: 20130708
Implantable Lead Implant Date: 20130708
Implantable Lead Location: 753859
Implantable Lead Location: 753860
Implantable Lead Model: 1948
Implantable Pulse Generator Implant Date: 20130708
Lead Channel Impedance Value: 410 Ohm
Lead Channel Impedance Value: 530 Ohm
Lead Channel Pacing Threshold Amplitude: 0.625 V
Lead Channel Pacing Threshold Amplitude: 0.875 V
Lead Channel Pacing Threshold Pulse Width: 0.4 ms
Lead Channel Pacing Threshold Pulse Width: 0.6 ms
Lead Channel Sensing Intrinsic Amplitude: 11.2 mV
Lead Channel Sensing Intrinsic Amplitude: 3.5 mV
Lead Channel Setting Pacing Amplitude: 1.125
Lead Channel Setting Pacing Amplitude: 1.625
Lead Channel Setting Pacing Pulse Width: 0.6 ms
Lead Channel Setting Sensing Sensitivity: 2 mV
Pulse Gen Model: 2110
Pulse Gen Serial Number: 7350984

## 2018-12-28 DIAGNOSIS — Z9181 History of falling: Secondary | ICD-10-CM | POA: Diagnosis not present

## 2018-12-28 DIAGNOSIS — F0280 Dementia in other diseases classified elsewhere without behavioral disturbance: Secondary | ICD-10-CM | POA: Diagnosis not present

## 2018-12-28 DIAGNOSIS — Z950 Presence of cardiac pacemaker: Secondary | ICD-10-CM | POA: Diagnosis not present

## 2018-12-28 DIAGNOSIS — E559 Vitamin D deficiency, unspecified: Secondary | ICD-10-CM | POA: Diagnosis not present

## 2018-12-28 DIAGNOSIS — H9190 Unspecified hearing loss, unspecified ear: Secondary | ICD-10-CM | POA: Diagnosis not present

## 2018-12-28 DIAGNOSIS — I2510 Atherosclerotic heart disease of native coronary artery without angina pectoris: Secondary | ICD-10-CM | POA: Diagnosis not present

## 2018-12-28 DIAGNOSIS — G9589 Other specified diseases of spinal cord: Secondary | ICD-10-CM | POA: Diagnosis not present

## 2018-12-28 DIAGNOSIS — K5790 Diverticulosis of intestine, part unspecified, without perforation or abscess without bleeding: Secondary | ICD-10-CM | POA: Diagnosis not present

## 2018-12-28 DIAGNOSIS — M19041 Primary osteoarthritis, right hand: Secondary | ICD-10-CM | POA: Diagnosis not present

## 2018-12-28 DIAGNOSIS — I129 Hypertensive chronic kidney disease with stage 1 through stage 4 chronic kidney disease, or unspecified chronic kidney disease: Secondary | ICD-10-CM | POA: Diagnosis not present

## 2018-12-28 DIAGNOSIS — Z951 Presence of aortocoronary bypass graft: Secondary | ICD-10-CM | POA: Diagnosis not present

## 2018-12-28 DIAGNOSIS — M19042 Primary osteoarthritis, left hand: Secondary | ICD-10-CM | POA: Diagnosis not present

## 2018-12-28 DIAGNOSIS — N184 Chronic kidney disease, stage 4 (severe): Secondary | ICD-10-CM | POA: Diagnosis not present

## 2018-12-28 DIAGNOSIS — N401 Enlarged prostate with lower urinary tract symptoms: Secondary | ICD-10-CM | POA: Diagnosis not present

## 2018-12-28 DIAGNOSIS — H6120 Impacted cerumen, unspecified ear: Secondary | ICD-10-CM | POA: Diagnosis not present

## 2018-12-28 DIAGNOSIS — Z7982 Long term (current) use of aspirin: Secondary | ICD-10-CM | POA: Diagnosis not present

## 2018-12-28 DIAGNOSIS — I4891 Unspecified atrial fibrillation: Secondary | ICD-10-CM | POA: Diagnosis not present

## 2018-12-28 DIAGNOSIS — R3915 Urgency of urination: Secondary | ICD-10-CM | POA: Diagnosis not present

## 2018-12-28 DIAGNOSIS — E782 Mixed hyperlipidemia: Secondary | ICD-10-CM | POA: Diagnosis not present

## 2018-12-28 DIAGNOSIS — G629 Polyneuropathy, unspecified: Secondary | ICD-10-CM | POA: Diagnosis not present

## 2018-12-28 DIAGNOSIS — K219 Gastro-esophageal reflux disease without esophagitis: Secondary | ICD-10-CM | POA: Diagnosis not present

## 2018-12-28 NOTE — Progress Notes (Signed)
Remote pacemaker transmission.   

## 2019-01-04 DIAGNOSIS — N184 Chronic kidney disease, stage 4 (severe): Secondary | ICD-10-CM | POA: Diagnosis not present

## 2019-01-04 DIAGNOSIS — I2510 Atherosclerotic heart disease of native coronary artery without angina pectoris: Secondary | ICD-10-CM | POA: Diagnosis not present

## 2019-01-04 DIAGNOSIS — I129 Hypertensive chronic kidney disease with stage 1 through stage 4 chronic kidney disease, or unspecified chronic kidney disease: Secondary | ICD-10-CM | POA: Diagnosis not present

## 2019-01-04 DIAGNOSIS — G9589 Other specified diseases of spinal cord: Secondary | ICD-10-CM | POA: Diagnosis not present

## 2019-01-04 DIAGNOSIS — G629 Polyneuropathy, unspecified: Secondary | ICD-10-CM | POA: Diagnosis not present

## 2019-01-04 DIAGNOSIS — F0280 Dementia in other diseases classified elsewhere without behavioral disturbance: Secondary | ICD-10-CM | POA: Diagnosis not present

## 2019-01-05 DIAGNOSIS — D539 Nutritional anemia, unspecified: Secondary | ICD-10-CM | POA: Diagnosis not present

## 2019-01-05 DIAGNOSIS — N400 Enlarged prostate without lower urinary tract symptoms: Secondary | ICD-10-CM | POA: Diagnosis not present

## 2019-01-05 DIAGNOSIS — N184 Chronic kidney disease, stage 4 (severe): Secondary | ICD-10-CM | POA: Diagnosis not present

## 2019-01-05 DIAGNOSIS — F0390 Unspecified dementia without behavioral disturbance: Secondary | ICD-10-CM | POA: Diagnosis not present

## 2019-01-05 DIAGNOSIS — R2681 Unsteadiness on feet: Secondary | ICD-10-CM | POA: Diagnosis not present

## 2019-01-05 DIAGNOSIS — H6123 Impacted cerumen, bilateral: Secondary | ICD-10-CM | POA: Diagnosis not present

## 2019-01-05 DIAGNOSIS — I4510 Unspecified right bundle-branch block: Secondary | ICD-10-CM | POA: Diagnosis not present

## 2019-01-05 DIAGNOSIS — Z23 Encounter for immunization: Secondary | ICD-10-CM | POA: Diagnosis not present

## 2019-01-05 DIAGNOSIS — E782 Mixed hyperlipidemia: Secondary | ICD-10-CM | POA: Diagnosis not present

## 2019-01-05 DIAGNOSIS — I4891 Unspecified atrial fibrillation: Secondary | ICD-10-CM | POA: Diagnosis not present

## 2019-01-05 DIAGNOSIS — Z950 Presence of cardiac pacemaker: Secondary | ICD-10-CM | POA: Diagnosis not present

## 2019-01-05 DIAGNOSIS — H9113 Presbycusis, bilateral: Secondary | ICD-10-CM | POA: Diagnosis not present

## 2019-01-07 DIAGNOSIS — M25521 Pain in right elbow: Secondary | ICD-10-CM | POA: Diagnosis not present

## 2019-01-07 DIAGNOSIS — I4891 Unspecified atrial fibrillation: Secondary | ICD-10-CM | POA: Diagnosis not present

## 2019-01-07 DIAGNOSIS — M7021 Olecranon bursitis, right elbow: Secondary | ICD-10-CM | POA: Diagnosis not present

## 2019-01-11 DIAGNOSIS — F0280 Dementia in other diseases classified elsewhere without behavioral disturbance: Secondary | ICD-10-CM | POA: Diagnosis not present

## 2019-01-11 DIAGNOSIS — I129 Hypertensive chronic kidney disease with stage 1 through stage 4 chronic kidney disease, or unspecified chronic kidney disease: Secondary | ICD-10-CM | POA: Diagnosis not present

## 2019-01-11 DIAGNOSIS — G629 Polyneuropathy, unspecified: Secondary | ICD-10-CM | POA: Diagnosis not present

## 2019-01-11 DIAGNOSIS — I2510 Atherosclerotic heart disease of native coronary artery without angina pectoris: Secondary | ICD-10-CM | POA: Diagnosis not present

## 2019-01-11 DIAGNOSIS — G9589 Other specified diseases of spinal cord: Secondary | ICD-10-CM | POA: Diagnosis not present

## 2019-01-11 DIAGNOSIS — N184 Chronic kidney disease, stage 4 (severe): Secondary | ICD-10-CM | POA: Diagnosis not present

## 2019-01-18 DIAGNOSIS — G9589 Other specified diseases of spinal cord: Secondary | ICD-10-CM | POA: Diagnosis not present

## 2019-01-18 DIAGNOSIS — N184 Chronic kidney disease, stage 4 (severe): Secondary | ICD-10-CM | POA: Diagnosis not present

## 2019-01-18 DIAGNOSIS — F0280 Dementia in other diseases classified elsewhere without behavioral disturbance: Secondary | ICD-10-CM | POA: Diagnosis not present

## 2019-01-18 DIAGNOSIS — I2510 Atherosclerotic heart disease of native coronary artery without angina pectoris: Secondary | ICD-10-CM | POA: Diagnosis not present

## 2019-01-18 DIAGNOSIS — I129 Hypertensive chronic kidney disease with stage 1 through stage 4 chronic kidney disease, or unspecified chronic kidney disease: Secondary | ICD-10-CM | POA: Diagnosis not present

## 2019-01-18 DIAGNOSIS — G629 Polyneuropathy, unspecified: Secondary | ICD-10-CM | POA: Diagnosis not present

## 2019-01-27 DIAGNOSIS — I129 Hypertensive chronic kidney disease with stage 1 through stage 4 chronic kidney disease, or unspecified chronic kidney disease: Secondary | ICD-10-CM | POA: Diagnosis not present

## 2019-01-27 DIAGNOSIS — Z951 Presence of aortocoronary bypass graft: Secondary | ICD-10-CM | POA: Diagnosis not present

## 2019-01-27 DIAGNOSIS — N184 Chronic kidney disease, stage 4 (severe): Secondary | ICD-10-CM | POA: Diagnosis not present

## 2019-01-27 DIAGNOSIS — M19042 Primary osteoarthritis, left hand: Secondary | ICD-10-CM | POA: Diagnosis not present

## 2019-01-27 DIAGNOSIS — Z950 Presence of cardiac pacemaker: Secondary | ICD-10-CM | POA: Diagnosis not present

## 2019-01-27 DIAGNOSIS — Z7982 Long term (current) use of aspirin: Secondary | ICD-10-CM | POA: Diagnosis not present

## 2019-01-27 DIAGNOSIS — H9190 Unspecified hearing loss, unspecified ear: Secondary | ICD-10-CM | POA: Diagnosis not present

## 2019-01-27 DIAGNOSIS — G9589 Other specified diseases of spinal cord: Secondary | ICD-10-CM | POA: Diagnosis not present

## 2019-01-27 DIAGNOSIS — F0280 Dementia in other diseases classified elsewhere without behavioral disturbance: Secondary | ICD-10-CM | POA: Diagnosis not present

## 2019-01-27 DIAGNOSIS — E782 Mixed hyperlipidemia: Secondary | ICD-10-CM | POA: Diagnosis not present

## 2019-01-27 DIAGNOSIS — M19041 Primary osteoarthritis, right hand: Secondary | ICD-10-CM | POA: Diagnosis not present

## 2019-01-27 DIAGNOSIS — H6120 Impacted cerumen, unspecified ear: Secondary | ICD-10-CM | POA: Diagnosis not present

## 2019-01-27 DIAGNOSIS — G629 Polyneuropathy, unspecified: Secondary | ICD-10-CM | POA: Diagnosis not present

## 2019-01-27 DIAGNOSIS — R3915 Urgency of urination: Secondary | ICD-10-CM | POA: Diagnosis not present

## 2019-01-27 DIAGNOSIS — K219 Gastro-esophageal reflux disease without esophagitis: Secondary | ICD-10-CM | POA: Diagnosis not present

## 2019-01-27 DIAGNOSIS — E559 Vitamin D deficiency, unspecified: Secondary | ICD-10-CM | POA: Diagnosis not present

## 2019-01-27 DIAGNOSIS — K5790 Diverticulosis of intestine, part unspecified, without perforation or abscess without bleeding: Secondary | ICD-10-CM | POA: Diagnosis not present

## 2019-01-27 DIAGNOSIS — I4891 Unspecified atrial fibrillation: Secondary | ICD-10-CM | POA: Diagnosis not present

## 2019-01-27 DIAGNOSIS — Z9181 History of falling: Secondary | ICD-10-CM | POA: Diagnosis not present

## 2019-01-27 DIAGNOSIS — N401 Enlarged prostate with lower urinary tract symptoms: Secondary | ICD-10-CM | POA: Diagnosis not present

## 2019-01-27 DIAGNOSIS — I2510 Atherosclerotic heart disease of native coronary artery without angina pectoris: Secondary | ICD-10-CM | POA: Diagnosis not present

## 2019-01-28 DIAGNOSIS — E782 Mixed hyperlipidemia: Secondary | ICD-10-CM | POA: Diagnosis not present

## 2019-01-28 DIAGNOSIS — E559 Vitamin D deficiency, unspecified: Secondary | ICD-10-CM | POA: Diagnosis not present

## 2019-01-28 DIAGNOSIS — D539 Nutritional anemia, unspecified: Secondary | ICD-10-CM | POA: Diagnosis not present

## 2019-01-28 DIAGNOSIS — N184 Chronic kidney disease, stage 4 (severe): Secondary | ICD-10-CM | POA: Diagnosis not present

## 2019-01-28 DIAGNOSIS — R3121 Asymptomatic microscopic hematuria: Secondary | ICD-10-CM | POA: Diagnosis not present

## 2019-02-02 ENCOUNTER — Ambulatory Visit: Payer: Medicare Other | Admitting: Interventional Cardiology

## 2019-02-02 ENCOUNTER — Other Ambulatory Visit (HOSPITAL_COMMUNITY): Payer: Medicare Other

## 2019-02-03 DIAGNOSIS — F0280 Dementia in other diseases classified elsewhere without behavioral disturbance: Secondary | ICD-10-CM | POA: Diagnosis not present

## 2019-02-03 DIAGNOSIS — I129 Hypertensive chronic kidney disease with stage 1 through stage 4 chronic kidney disease, or unspecified chronic kidney disease: Secondary | ICD-10-CM | POA: Diagnosis not present

## 2019-02-03 DIAGNOSIS — G9589 Other specified diseases of spinal cord: Secondary | ICD-10-CM | POA: Diagnosis not present

## 2019-02-03 DIAGNOSIS — G629 Polyneuropathy, unspecified: Secondary | ICD-10-CM | POA: Diagnosis not present

## 2019-02-03 DIAGNOSIS — N184 Chronic kidney disease, stage 4 (severe): Secondary | ICD-10-CM | POA: Diagnosis not present

## 2019-02-03 DIAGNOSIS — I2510 Atherosclerotic heart disease of native coronary artery without angina pectoris: Secondary | ICD-10-CM | POA: Diagnosis not present

## 2019-02-11 DIAGNOSIS — D696 Thrombocytopenia, unspecified: Secondary | ICD-10-CM | POA: Diagnosis not present

## 2019-02-11 DIAGNOSIS — N184 Chronic kidney disease, stage 4 (severe): Secondary | ICD-10-CM | POA: Diagnosis not present

## 2019-02-11 DIAGNOSIS — D531 Other megaloblastic anemias, not elsewhere classified: Secondary | ICD-10-CM | POA: Diagnosis not present

## 2019-02-11 DIAGNOSIS — I10 Essential (primary) hypertension: Secondary | ICD-10-CM | POA: Diagnosis not present

## 2019-02-11 DIAGNOSIS — Z0001 Encounter for general adult medical examination with abnormal findings: Secondary | ICD-10-CM | POA: Diagnosis not present

## 2019-02-11 DIAGNOSIS — Z1389 Encounter for screening for other disorder: Secondary | ICD-10-CM | POA: Diagnosis not present

## 2019-02-11 DIAGNOSIS — I714 Abdominal aortic aneurysm, without rupture: Secondary | ICD-10-CM | POA: Diagnosis not present

## 2019-02-17 DIAGNOSIS — F0280 Dementia in other diseases classified elsewhere without behavioral disturbance: Secondary | ICD-10-CM | POA: Diagnosis not present

## 2019-02-17 DIAGNOSIS — I129 Hypertensive chronic kidney disease with stage 1 through stage 4 chronic kidney disease, or unspecified chronic kidney disease: Secondary | ICD-10-CM | POA: Diagnosis not present

## 2019-02-17 DIAGNOSIS — G629 Polyneuropathy, unspecified: Secondary | ICD-10-CM | POA: Diagnosis not present

## 2019-02-17 DIAGNOSIS — N184 Chronic kidney disease, stage 4 (severe): Secondary | ICD-10-CM | POA: Diagnosis not present

## 2019-02-17 DIAGNOSIS — G9589 Other specified diseases of spinal cord: Secondary | ICD-10-CM | POA: Diagnosis not present

## 2019-02-17 DIAGNOSIS — I2510 Atherosclerotic heart disease of native coronary artery without angina pectoris: Secondary | ICD-10-CM | POA: Diagnosis not present

## 2019-02-22 DIAGNOSIS — I714 Abdominal aortic aneurysm, without rupture: Secondary | ICD-10-CM | POA: Diagnosis not present

## 2019-02-22 DIAGNOSIS — I441 Atrioventricular block, second degree: Secondary | ICD-10-CM | POA: Diagnosis not present

## 2019-02-22 DIAGNOSIS — N184 Chronic kidney disease, stage 4 (severe): Secondary | ICD-10-CM | POA: Diagnosis not present

## 2019-02-22 DIAGNOSIS — I2510 Atherosclerotic heart disease of native coronary artery without angina pectoris: Secondary | ICD-10-CM | POA: Diagnosis not present

## 2019-02-22 DIAGNOSIS — E782 Mixed hyperlipidemia: Secondary | ICD-10-CM | POA: Diagnosis not present

## 2019-02-22 DIAGNOSIS — I701 Atherosclerosis of renal artery: Secondary | ICD-10-CM | POA: Diagnosis not present

## 2019-02-22 DIAGNOSIS — I10 Essential (primary) hypertension: Secondary | ICD-10-CM | POA: Diagnosis not present

## 2019-02-22 DIAGNOSIS — I4891 Unspecified atrial fibrillation: Secondary | ICD-10-CM | POA: Diagnosis not present

## 2019-02-24 DIAGNOSIS — I129 Hypertensive chronic kidney disease with stage 1 through stage 4 chronic kidney disease, or unspecified chronic kidney disease: Secondary | ICD-10-CM | POA: Diagnosis not present

## 2019-02-24 DIAGNOSIS — G9589 Other specified diseases of spinal cord: Secondary | ICD-10-CM | POA: Diagnosis not present

## 2019-02-24 DIAGNOSIS — I2510 Atherosclerotic heart disease of native coronary artery without angina pectoris: Secondary | ICD-10-CM | POA: Diagnosis not present

## 2019-02-24 DIAGNOSIS — N184 Chronic kidney disease, stage 4 (severe): Secondary | ICD-10-CM | POA: Diagnosis not present

## 2019-02-24 DIAGNOSIS — G629 Polyneuropathy, unspecified: Secondary | ICD-10-CM | POA: Diagnosis not present

## 2019-02-24 DIAGNOSIS — F0280 Dementia in other diseases classified elsewhere without behavioral disturbance: Secondary | ICD-10-CM | POA: Diagnosis not present

## 2019-02-26 DIAGNOSIS — K5790 Diverticulosis of intestine, part unspecified, without perforation or abscess without bleeding: Secondary | ICD-10-CM | POA: Diagnosis not present

## 2019-02-26 DIAGNOSIS — N401 Enlarged prostate with lower urinary tract symptoms: Secondary | ICD-10-CM | POA: Diagnosis not present

## 2019-02-26 DIAGNOSIS — E782 Mixed hyperlipidemia: Secondary | ICD-10-CM | POA: Diagnosis not present

## 2019-02-26 DIAGNOSIS — E559 Vitamin D deficiency, unspecified: Secondary | ICD-10-CM | POA: Diagnosis not present

## 2019-02-26 DIAGNOSIS — F0280 Dementia in other diseases classified elsewhere without behavioral disturbance: Secondary | ICD-10-CM | POA: Diagnosis not present

## 2019-02-26 DIAGNOSIS — I129 Hypertensive chronic kidney disease with stage 1 through stage 4 chronic kidney disease, or unspecified chronic kidney disease: Secondary | ICD-10-CM | POA: Diagnosis not present

## 2019-02-26 DIAGNOSIS — N184 Chronic kidney disease, stage 4 (severe): Secondary | ICD-10-CM | POA: Diagnosis not present

## 2019-02-26 DIAGNOSIS — Z950 Presence of cardiac pacemaker: Secondary | ICD-10-CM | POA: Diagnosis not present

## 2019-02-26 DIAGNOSIS — M19041 Primary osteoarthritis, right hand: Secondary | ICD-10-CM | POA: Diagnosis not present

## 2019-02-26 DIAGNOSIS — K219 Gastro-esophageal reflux disease without esophagitis: Secondary | ICD-10-CM | POA: Diagnosis not present

## 2019-02-26 DIAGNOSIS — H9190 Unspecified hearing loss, unspecified ear: Secondary | ICD-10-CM | POA: Diagnosis not present

## 2019-02-26 DIAGNOSIS — H6120 Impacted cerumen, unspecified ear: Secondary | ICD-10-CM | POA: Diagnosis not present

## 2019-02-26 DIAGNOSIS — Z7982 Long term (current) use of aspirin: Secondary | ICD-10-CM | POA: Diagnosis not present

## 2019-02-26 DIAGNOSIS — Z951 Presence of aortocoronary bypass graft: Secondary | ICD-10-CM | POA: Diagnosis not present

## 2019-02-26 DIAGNOSIS — Z9181 History of falling: Secondary | ICD-10-CM | POA: Diagnosis not present

## 2019-02-26 DIAGNOSIS — I4891 Unspecified atrial fibrillation: Secondary | ICD-10-CM | POA: Diagnosis not present

## 2019-02-26 DIAGNOSIS — R3915 Urgency of urination: Secondary | ICD-10-CM | POA: Diagnosis not present

## 2019-02-26 DIAGNOSIS — G629 Polyneuropathy, unspecified: Secondary | ICD-10-CM | POA: Diagnosis not present

## 2019-02-26 DIAGNOSIS — M19042 Primary osteoarthritis, left hand: Secondary | ICD-10-CM | POA: Diagnosis not present

## 2019-02-26 DIAGNOSIS — I2510 Atherosclerotic heart disease of native coronary artery without angina pectoris: Secondary | ICD-10-CM | POA: Diagnosis not present

## 2019-02-26 DIAGNOSIS — G9589 Other specified diseases of spinal cord: Secondary | ICD-10-CM | POA: Diagnosis not present

## 2019-03-08 DIAGNOSIS — G9589 Other specified diseases of spinal cord: Secondary | ICD-10-CM | POA: Diagnosis not present

## 2019-03-08 DIAGNOSIS — I2510 Atherosclerotic heart disease of native coronary artery without angina pectoris: Secondary | ICD-10-CM | POA: Diagnosis not present

## 2019-03-08 DIAGNOSIS — N184 Chronic kidney disease, stage 4 (severe): Secondary | ICD-10-CM | POA: Diagnosis not present

## 2019-03-08 DIAGNOSIS — G629 Polyneuropathy, unspecified: Secondary | ICD-10-CM | POA: Diagnosis not present

## 2019-03-08 DIAGNOSIS — I129 Hypertensive chronic kidney disease with stage 1 through stage 4 chronic kidney disease, or unspecified chronic kidney disease: Secondary | ICD-10-CM | POA: Diagnosis not present

## 2019-03-08 DIAGNOSIS — F0280 Dementia in other diseases classified elsewhere without behavioral disturbance: Secondary | ICD-10-CM | POA: Diagnosis not present

## 2019-03-11 DIAGNOSIS — I10 Essential (primary) hypertension: Secondary | ICD-10-CM | POA: Diagnosis not present

## 2019-03-11 DIAGNOSIS — E538 Deficiency of other specified B group vitamins: Secondary | ICD-10-CM | POA: Diagnosis not present

## 2019-03-12 DIAGNOSIS — Z23 Encounter for immunization: Secondary | ICD-10-CM | POA: Diagnosis not present

## 2019-03-18 DIAGNOSIS — N400 Enlarged prostate without lower urinary tract symptoms: Secondary | ICD-10-CM | POA: Diagnosis not present

## 2019-03-18 DIAGNOSIS — R7989 Other specified abnormal findings of blood chemistry: Secondary | ICD-10-CM | POA: Diagnosis not present

## 2019-03-18 DIAGNOSIS — E785 Hyperlipidemia, unspecified: Secondary | ICD-10-CM | POA: Diagnosis not present

## 2019-03-18 DIAGNOSIS — I10 Essential (primary) hypertension: Secondary | ICD-10-CM | POA: Diagnosis not present

## 2019-03-18 DIAGNOSIS — N184 Chronic kidney disease, stage 4 (severe): Secondary | ICD-10-CM | POA: Diagnosis not present

## 2019-03-18 DIAGNOSIS — I129 Hypertensive chronic kidney disease with stage 1 through stage 4 chronic kidney disease, or unspecified chronic kidney disease: Secondary | ICD-10-CM | POA: Diagnosis not present

## 2019-03-24 DIAGNOSIS — I129 Hypertensive chronic kidney disease with stage 1 through stage 4 chronic kidney disease, or unspecified chronic kidney disease: Secondary | ICD-10-CM | POA: Diagnosis not present

## 2019-03-24 DIAGNOSIS — N184 Chronic kidney disease, stage 4 (severe): Secondary | ICD-10-CM | POA: Diagnosis not present

## 2019-03-24 DIAGNOSIS — I2510 Atherosclerotic heart disease of native coronary artery without angina pectoris: Secondary | ICD-10-CM | POA: Diagnosis not present

## 2019-03-24 DIAGNOSIS — F0280 Dementia in other diseases classified elsewhere without behavioral disturbance: Secondary | ICD-10-CM | POA: Diagnosis not present

## 2019-03-24 DIAGNOSIS — G629 Polyneuropathy, unspecified: Secondary | ICD-10-CM | POA: Diagnosis not present

## 2019-03-24 DIAGNOSIS — G9589 Other specified diseases of spinal cord: Secondary | ICD-10-CM | POA: Diagnosis not present

## 2019-03-28 DIAGNOSIS — I4891 Unspecified atrial fibrillation: Secondary | ICD-10-CM | POA: Diagnosis not present

## 2019-03-28 DIAGNOSIS — Z951 Presence of aortocoronary bypass graft: Secondary | ICD-10-CM | POA: Diagnosis not present

## 2019-03-28 DIAGNOSIS — N401 Enlarged prostate with lower urinary tract symptoms: Secondary | ICD-10-CM | POA: Diagnosis not present

## 2019-03-28 DIAGNOSIS — G629 Polyneuropathy, unspecified: Secondary | ICD-10-CM | POA: Diagnosis not present

## 2019-03-28 DIAGNOSIS — Z950 Presence of cardiac pacemaker: Secondary | ICD-10-CM | POA: Diagnosis not present

## 2019-03-28 DIAGNOSIS — H6120 Impacted cerumen, unspecified ear: Secondary | ICD-10-CM | POA: Diagnosis not present

## 2019-03-28 DIAGNOSIS — R3915 Urgency of urination: Secondary | ICD-10-CM | POA: Diagnosis not present

## 2019-03-28 DIAGNOSIS — N184 Chronic kidney disease, stage 4 (severe): Secondary | ICD-10-CM | POA: Diagnosis not present

## 2019-03-28 DIAGNOSIS — M19042 Primary osteoarthritis, left hand: Secondary | ICD-10-CM | POA: Diagnosis not present

## 2019-03-28 DIAGNOSIS — K5790 Diverticulosis of intestine, part unspecified, without perforation or abscess without bleeding: Secondary | ICD-10-CM | POA: Diagnosis not present

## 2019-03-28 DIAGNOSIS — H9190 Unspecified hearing loss, unspecified ear: Secondary | ICD-10-CM | POA: Diagnosis not present

## 2019-03-28 DIAGNOSIS — M19041 Primary osteoarthritis, right hand: Secondary | ICD-10-CM | POA: Diagnosis not present

## 2019-03-28 DIAGNOSIS — G9589 Other specified diseases of spinal cord: Secondary | ICD-10-CM | POA: Diagnosis not present

## 2019-03-28 DIAGNOSIS — I129 Hypertensive chronic kidney disease with stage 1 through stage 4 chronic kidney disease, or unspecified chronic kidney disease: Secondary | ICD-10-CM | POA: Diagnosis not present

## 2019-03-28 DIAGNOSIS — Z7982 Long term (current) use of aspirin: Secondary | ICD-10-CM | POA: Diagnosis not present

## 2019-03-28 DIAGNOSIS — F0280 Dementia in other diseases classified elsewhere without behavioral disturbance: Secondary | ICD-10-CM | POA: Diagnosis not present

## 2019-03-28 DIAGNOSIS — E559 Vitamin D deficiency, unspecified: Secondary | ICD-10-CM | POA: Diagnosis not present

## 2019-03-28 DIAGNOSIS — I2510 Atherosclerotic heart disease of native coronary artery without angina pectoris: Secondary | ICD-10-CM | POA: Diagnosis not present

## 2019-03-28 DIAGNOSIS — E782 Mixed hyperlipidemia: Secondary | ICD-10-CM | POA: Diagnosis not present

## 2019-03-28 DIAGNOSIS — K219 Gastro-esophageal reflux disease without esophagitis: Secondary | ICD-10-CM | POA: Diagnosis not present

## 2019-03-28 DIAGNOSIS — Z9181 History of falling: Secondary | ICD-10-CM | POA: Diagnosis not present

## 2019-04-07 DIAGNOSIS — I129 Hypertensive chronic kidney disease with stage 1 through stage 4 chronic kidney disease, or unspecified chronic kidney disease: Secondary | ICD-10-CM | POA: Diagnosis not present

## 2019-04-07 DIAGNOSIS — G9589 Other specified diseases of spinal cord: Secondary | ICD-10-CM | POA: Diagnosis not present

## 2019-04-07 DIAGNOSIS — G629 Polyneuropathy, unspecified: Secondary | ICD-10-CM | POA: Diagnosis not present

## 2019-04-07 DIAGNOSIS — I2510 Atherosclerotic heart disease of native coronary artery without angina pectoris: Secondary | ICD-10-CM | POA: Diagnosis not present

## 2019-04-07 DIAGNOSIS — N184 Chronic kidney disease, stage 4 (severe): Secondary | ICD-10-CM | POA: Diagnosis not present

## 2019-04-07 DIAGNOSIS — F0280 Dementia in other diseases classified elsewhere without behavioral disturbance: Secondary | ICD-10-CM | POA: Diagnosis not present

## 2019-04-08 DIAGNOSIS — E538 Deficiency of other specified B group vitamins: Secondary | ICD-10-CM | POA: Diagnosis not present

## 2019-04-08 DIAGNOSIS — I10 Essential (primary) hypertension: Secondary | ICD-10-CM | POA: Diagnosis not present

## 2019-04-12 DIAGNOSIS — G9589 Other specified diseases of spinal cord: Secondary | ICD-10-CM | POA: Diagnosis not present

## 2019-04-12 DIAGNOSIS — G629 Polyneuropathy, unspecified: Secondary | ICD-10-CM | POA: Diagnosis not present

## 2019-04-12 DIAGNOSIS — F0280 Dementia in other diseases classified elsewhere without behavioral disturbance: Secondary | ICD-10-CM | POA: Diagnosis not present

## 2019-04-12 DIAGNOSIS — I129 Hypertensive chronic kidney disease with stage 1 through stage 4 chronic kidney disease, or unspecified chronic kidney disease: Secondary | ICD-10-CM | POA: Diagnosis not present

## 2019-04-12 DIAGNOSIS — I2510 Atherosclerotic heart disease of native coronary artery without angina pectoris: Secondary | ICD-10-CM | POA: Diagnosis not present

## 2019-04-12 DIAGNOSIS — N184 Chronic kidney disease, stage 4 (severe): Secondary | ICD-10-CM | POA: Diagnosis not present

## 2019-04-22 DIAGNOSIS — E785 Hyperlipidemia, unspecified: Secondary | ICD-10-CM | POA: Diagnosis not present

## 2019-04-22 DIAGNOSIS — R319 Hematuria, unspecified: Secondary | ICD-10-CM | POA: Diagnosis not present

## 2019-04-22 DIAGNOSIS — R7989 Other specified abnormal findings of blood chemistry: Secondary | ICD-10-CM | POA: Diagnosis not present

## 2019-04-22 DIAGNOSIS — N184 Chronic kidney disease, stage 4 (severe): Secondary | ICD-10-CM | POA: Diagnosis not present

## 2019-04-26 DIAGNOSIS — G629 Polyneuropathy, unspecified: Secondary | ICD-10-CM | POA: Diagnosis not present

## 2019-04-26 DIAGNOSIS — I129 Hypertensive chronic kidney disease with stage 1 through stage 4 chronic kidney disease, or unspecified chronic kidney disease: Secondary | ICD-10-CM | POA: Diagnosis not present

## 2019-04-26 DIAGNOSIS — I2510 Atherosclerotic heart disease of native coronary artery without angina pectoris: Secondary | ICD-10-CM | POA: Diagnosis not present

## 2019-04-26 DIAGNOSIS — N184 Chronic kidney disease, stage 4 (severe): Secondary | ICD-10-CM | POA: Diagnosis not present

## 2019-04-26 DIAGNOSIS — G9589 Other specified diseases of spinal cord: Secondary | ICD-10-CM | POA: Diagnosis not present

## 2019-04-26 DIAGNOSIS — F0280 Dementia in other diseases classified elsewhere without behavioral disturbance: Secondary | ICD-10-CM | POA: Diagnosis not present

## 2019-04-27 DIAGNOSIS — K5790 Diverticulosis of intestine, part unspecified, without perforation or abscess without bleeding: Secondary | ICD-10-CM | POA: Diagnosis not present

## 2019-04-27 DIAGNOSIS — I4891 Unspecified atrial fibrillation: Secondary | ICD-10-CM | POA: Diagnosis not present

## 2019-04-27 DIAGNOSIS — H9190 Unspecified hearing loss, unspecified ear: Secondary | ICD-10-CM | POA: Diagnosis not present

## 2019-04-27 DIAGNOSIS — Z7982 Long term (current) use of aspirin: Secondary | ICD-10-CM | POA: Diagnosis not present

## 2019-04-27 DIAGNOSIS — E782 Mixed hyperlipidemia: Secondary | ICD-10-CM | POA: Diagnosis not present

## 2019-04-27 DIAGNOSIS — Z951 Presence of aortocoronary bypass graft: Secondary | ICD-10-CM | POA: Diagnosis not present

## 2019-04-27 DIAGNOSIS — Z950 Presence of cardiac pacemaker: Secondary | ICD-10-CM | POA: Diagnosis not present

## 2019-04-27 DIAGNOSIS — N401 Enlarged prostate with lower urinary tract symptoms: Secondary | ICD-10-CM | POA: Diagnosis not present

## 2019-04-27 DIAGNOSIS — I2510 Atherosclerotic heart disease of native coronary artery without angina pectoris: Secondary | ICD-10-CM | POA: Diagnosis not present

## 2019-04-27 DIAGNOSIS — N184 Chronic kidney disease, stage 4 (severe): Secondary | ICD-10-CM | POA: Diagnosis not present

## 2019-04-27 DIAGNOSIS — M19042 Primary osteoarthritis, left hand: Secondary | ICD-10-CM | POA: Diagnosis not present

## 2019-04-27 DIAGNOSIS — M19041 Primary osteoarthritis, right hand: Secondary | ICD-10-CM | POA: Diagnosis not present

## 2019-04-27 DIAGNOSIS — K219 Gastro-esophageal reflux disease without esophagitis: Secondary | ICD-10-CM | POA: Diagnosis not present

## 2019-04-27 DIAGNOSIS — E559 Vitamin D deficiency, unspecified: Secondary | ICD-10-CM | POA: Diagnosis not present

## 2019-04-27 DIAGNOSIS — F0280 Dementia in other diseases classified elsewhere without behavioral disturbance: Secondary | ICD-10-CM | POA: Diagnosis not present

## 2019-04-27 DIAGNOSIS — R3915 Urgency of urination: Secondary | ICD-10-CM | POA: Diagnosis not present

## 2019-04-27 DIAGNOSIS — Z9181 History of falling: Secondary | ICD-10-CM | POA: Diagnosis not present

## 2019-04-27 DIAGNOSIS — I129 Hypertensive chronic kidney disease with stage 1 through stage 4 chronic kidney disease, or unspecified chronic kidney disease: Secondary | ICD-10-CM | POA: Diagnosis not present

## 2019-04-27 DIAGNOSIS — G9589 Other specified diseases of spinal cord: Secondary | ICD-10-CM | POA: Diagnosis not present

## 2019-04-27 DIAGNOSIS — H6120 Impacted cerumen, unspecified ear: Secondary | ICD-10-CM | POA: Diagnosis not present

## 2019-04-27 DIAGNOSIS — G629 Polyneuropathy, unspecified: Secondary | ICD-10-CM | POA: Diagnosis not present

## 2019-04-28 DIAGNOSIS — G9589 Other specified diseases of spinal cord: Secondary | ICD-10-CM | POA: Diagnosis not present

## 2019-04-28 DIAGNOSIS — N184 Chronic kidney disease, stage 4 (severe): Secondary | ICD-10-CM | POA: Diagnosis not present

## 2019-04-28 DIAGNOSIS — F0280 Dementia in other diseases classified elsewhere without behavioral disturbance: Secondary | ICD-10-CM | POA: Diagnosis not present

## 2019-04-28 DIAGNOSIS — I2510 Atherosclerotic heart disease of native coronary artery without angina pectoris: Secondary | ICD-10-CM | POA: Diagnosis not present

## 2019-04-28 DIAGNOSIS — G629 Polyneuropathy, unspecified: Secondary | ICD-10-CM | POA: Diagnosis not present

## 2019-04-28 DIAGNOSIS — I129 Hypertensive chronic kidney disease with stage 1 through stage 4 chronic kidney disease, or unspecified chronic kidney disease: Secondary | ICD-10-CM | POA: Diagnosis not present

## 2019-04-29 DIAGNOSIS — H9193 Unspecified hearing loss, bilateral: Secondary | ICD-10-CM | POA: Diagnosis not present

## 2019-04-29 DIAGNOSIS — E538 Deficiency of other specified B group vitamins: Secondary | ICD-10-CM | POA: Diagnosis not present

## 2019-04-29 DIAGNOSIS — I10 Essential (primary) hypertension: Secondary | ICD-10-CM | POA: Diagnosis not present

## 2019-04-29 DIAGNOSIS — R319 Hematuria, unspecified: Secondary | ICD-10-CM | POA: Diagnosis not present

## 2019-05-27 DIAGNOSIS — Z7982 Long term (current) use of aspirin: Secondary | ICD-10-CM | POA: Diagnosis not present

## 2019-05-27 DIAGNOSIS — H9190 Unspecified hearing loss, unspecified ear: Secondary | ICD-10-CM | POA: Diagnosis not present

## 2019-05-27 DIAGNOSIS — K219 Gastro-esophageal reflux disease without esophagitis: Secondary | ICD-10-CM | POA: Diagnosis not present

## 2019-05-27 DIAGNOSIS — M19042 Primary osteoarthritis, left hand: Secondary | ICD-10-CM | POA: Diagnosis not present

## 2019-05-27 DIAGNOSIS — M19041 Primary osteoarthritis, right hand: Secondary | ICD-10-CM | POA: Diagnosis not present

## 2019-05-27 DIAGNOSIS — I4891 Unspecified atrial fibrillation: Secondary | ICD-10-CM | POA: Diagnosis not present

## 2019-05-27 DIAGNOSIS — G629 Polyneuropathy, unspecified: Secondary | ICD-10-CM | POA: Diagnosis not present

## 2019-05-27 DIAGNOSIS — K5790 Diverticulosis of intestine, part unspecified, without perforation or abscess without bleeding: Secondary | ICD-10-CM | POA: Diagnosis not present

## 2019-05-27 DIAGNOSIS — Z950 Presence of cardiac pacemaker: Secondary | ICD-10-CM | POA: Diagnosis not present

## 2019-05-27 DIAGNOSIS — G9589 Other specified diseases of spinal cord: Secondary | ICD-10-CM | POA: Diagnosis not present

## 2019-05-27 DIAGNOSIS — Z9181 History of falling: Secondary | ICD-10-CM | POA: Diagnosis not present

## 2019-05-27 DIAGNOSIS — I129 Hypertensive chronic kidney disease with stage 1 through stage 4 chronic kidney disease, or unspecified chronic kidney disease: Secondary | ICD-10-CM | POA: Diagnosis not present

## 2019-05-27 DIAGNOSIS — I701 Atherosclerosis of renal artery: Secondary | ICD-10-CM | POA: Diagnosis not present

## 2019-05-27 DIAGNOSIS — N401 Enlarged prostate with lower urinary tract symptoms: Secondary | ICD-10-CM | POA: Diagnosis not present

## 2019-05-27 DIAGNOSIS — Z951 Presence of aortocoronary bypass graft: Secondary | ICD-10-CM | POA: Diagnosis not present

## 2019-05-27 DIAGNOSIS — E559 Vitamin D deficiency, unspecified: Secondary | ICD-10-CM | POA: Diagnosis not present

## 2019-05-27 DIAGNOSIS — I10 Essential (primary) hypertension: Secondary | ICD-10-CM | POA: Diagnosis not present

## 2019-05-27 DIAGNOSIS — H6120 Impacted cerumen, unspecified ear: Secondary | ICD-10-CM | POA: Diagnosis not present

## 2019-05-27 DIAGNOSIS — I714 Abdominal aortic aneurysm, without rupture: Secondary | ICD-10-CM | POA: Diagnosis not present

## 2019-05-27 DIAGNOSIS — R3915 Urgency of urination: Secondary | ICD-10-CM | POA: Diagnosis not present

## 2019-05-27 DIAGNOSIS — N184 Chronic kidney disease, stage 4 (severe): Secondary | ICD-10-CM | POA: Diagnosis not present

## 2019-05-27 DIAGNOSIS — I2510 Atherosclerotic heart disease of native coronary artery without angina pectoris: Secondary | ICD-10-CM | POA: Diagnosis not present

## 2019-05-27 DIAGNOSIS — E782 Mixed hyperlipidemia: Secondary | ICD-10-CM | POA: Diagnosis not present

## 2019-05-27 DIAGNOSIS — I441 Atrioventricular block, second degree: Secondary | ICD-10-CM | POA: Diagnosis not present

## 2019-05-27 DIAGNOSIS — F0280 Dementia in other diseases classified elsewhere without behavioral disturbance: Secondary | ICD-10-CM | POA: Diagnosis not present

## 2019-05-30 DIAGNOSIS — I129 Hypertensive chronic kidney disease with stage 1 through stage 4 chronic kidney disease, or unspecified chronic kidney disease: Secondary | ICD-10-CM | POA: Diagnosis not present

## 2019-05-30 DIAGNOSIS — G9589 Other specified diseases of spinal cord: Secondary | ICD-10-CM | POA: Diagnosis not present

## 2019-05-30 DIAGNOSIS — I2510 Atherosclerotic heart disease of native coronary artery without angina pectoris: Secondary | ICD-10-CM | POA: Diagnosis not present

## 2019-05-30 DIAGNOSIS — F0280 Dementia in other diseases classified elsewhere without behavioral disturbance: Secondary | ICD-10-CM | POA: Diagnosis not present

## 2019-05-30 DIAGNOSIS — G629 Polyneuropathy, unspecified: Secondary | ICD-10-CM | POA: Diagnosis not present

## 2019-05-30 DIAGNOSIS — N184 Chronic kidney disease, stage 4 (severe): Secondary | ICD-10-CM | POA: Diagnosis not present

## 2019-07-22 ENCOUNTER — Telehealth: Payer: Self-pay | Admitting: Internal Medicine

## 2019-07-22 NOTE — Telephone Encounter (Signed)
New message    Pt has moved to [place] and has established care with cardiology there.

## 2022-04-16 DEATH — deceased
